# Patient Record
Sex: Female | Born: 1990 | Race: White | Hispanic: No | Marital: Single | Smoking: Current every day smoker
Health system: Southern US, Community
[De-identification: ages and names within clinical notes are randomized; demographics above are authoritative.]

## PROBLEM LIST (undated history)

## (undated) DIAGNOSIS — Z789 Other specified health status: Secondary | ICD-10-CM

## (undated) HISTORY — PX: NO PAST SURGERIES: SHX2092

---

## 2018-07-09 ENCOUNTER — Emergency Department: Payer: Medicaid - Out of State

## 2018-07-09 ENCOUNTER — Inpatient Hospital Stay: Payer: Medicaid - Out of State

## 2018-07-09 ENCOUNTER — Inpatient Hospital Stay
Admission: EM | Admit: 2018-07-09 | Discharge: 2018-07-12 | DRG: 417 | Disposition: A | Discharge status: Home / Self Care | Payer: Medicaid - Out of State | Attending: Specialist | Admitting: Specialist

## 2018-07-09 ENCOUNTER — Other Ambulatory Visit: Payer: Self-pay

## 2018-07-09 ENCOUNTER — Encounter: Payer: Self-pay | Admitting: Emergency Medicine

## 2018-07-09 DIAGNOSIS — K8021 Calculus of gallbladder without cholecystitis with obstruction: Secondary | ICD-10-CM

## 2018-07-09 DIAGNOSIS — K8063 Calculus of gallbladder and bile duct with acute cholecystitis with obstruction: Principal | ICD-10-CM | POA: Diagnosis present

## 2018-07-09 DIAGNOSIS — K851 Biliary acute pancreatitis without necrosis or infection: Secondary | ICD-10-CM | POA: Diagnosis present

## 2018-07-09 DIAGNOSIS — R740 Nonspecific elevation of levels of transaminase and lactic acid dehydrogenase [LDH]: Secondary | ICD-10-CM | POA: Diagnosis present

## 2018-07-09 DIAGNOSIS — F17200 Nicotine dependence, unspecified, uncomplicated: Secondary | ICD-10-CM | POA: Diagnosis present

## 2018-07-09 DIAGNOSIS — R1011 Right upper quadrant pain: Secondary | ICD-10-CM | POA: Diagnosis present

## 2018-07-09 DIAGNOSIS — K8050 Calculus of bile duct without cholangitis or cholecystitis without obstruction: Secondary | ICD-10-CM | POA: Diagnosis present

## 2018-07-09 DIAGNOSIS — K598 Other specified functional intestinal disorders: Secondary | ICD-10-CM | POA: Diagnosis present

## 2018-07-09 DIAGNOSIS — R112 Nausea with vomiting, unspecified: Secondary | ICD-10-CM | POA: Diagnosis present

## 2018-07-09 DIAGNOSIS — R748 Abnormal levels of other serum enzymes: Secondary | ICD-10-CM | POA: Diagnosis present

## 2018-07-09 DIAGNOSIS — Z1159 Encounter for screening for other viral diseases: Secondary | ICD-10-CM | POA: Diagnosis not present

## 2018-07-09 DIAGNOSIS — E876 Hypokalemia: Secondary | ICD-10-CM | POA: Diagnosis present

## 2018-07-09 DIAGNOSIS — R109 Unspecified abdominal pain: Secondary | ICD-10-CM

## 2018-07-09 HISTORY — DX: Other specified health status: Z78.9

## 2018-07-09 LAB — URINALYSIS, COMPLETE (UACMP) WITH MICROSCOPIC
Bacteria, UA: NONE SEEN
Bilirubin Urine: NEGATIVE
Glucose, UA: NEGATIVE mg/dL
Hgb urine dipstick: NEGATIVE
Ketones, ur: NEGATIVE mg/dL
Leukocytes,Ua: NEGATIVE
Nitrite: NEGATIVE
Protein, ur: NEGATIVE mg/dL
Specific Gravity, Urine: 1.024 (ref 1.005–1.030)
pH: 6 (ref 5.0–8.0)

## 2018-07-09 LAB — HEPATIC FUNCTION PANEL
ALT: 212 U/L — ABNORMAL HIGH (ref 0–44)
AST: 374 U/L — ABNORMAL HIGH (ref 15–41)
Albumin: 4.2 g/dL (ref 3.5–5.0)
Alkaline Phosphatase: 97 U/L (ref 38–126)
Bilirubin, Direct: 0.4 mg/dL — ABNORMAL HIGH (ref 0.0–0.2)
Indirect Bilirubin: 0.1 mg/dL — ABNORMAL LOW (ref 0.3–0.9)
Total Bilirubin: 0.5 mg/dL (ref 0.3–1.2)
Total Protein: 6.8 g/dL (ref 6.5–8.1)

## 2018-07-09 LAB — CBC WITH DIFFERENTIAL/PLATELET
Abs Immature Granulocytes: 0.07 10*3/uL (ref 0.00–0.07)
Basophils Absolute: 0 10*3/uL (ref 0.0–0.1)
Basophils Relative: 0 %
Eosinophils Absolute: 0 10*3/uL (ref 0.0–0.5)
Eosinophils Relative: 0 %
HCT: 37.5 % (ref 36.0–46.0)
Hemoglobin: 12.9 g/dL (ref 12.0–15.0)
Immature Granulocytes: 1 %
Lymphocytes Relative: 9 %
Lymphs Abs: 1.4 10*3/uL (ref 0.7–4.0)
MCH: 30.1 pg (ref 26.0–34.0)
MCHC: 34.4 g/dL (ref 30.0–36.0)
MCV: 87.6 fL (ref 80.0–100.0)
Monocytes Absolute: 1.3 10*3/uL — ABNORMAL HIGH (ref 0.1–1.0)
Monocytes Relative: 8 %
Neutro Abs: 12.6 10*3/uL — ABNORMAL HIGH (ref 1.7–7.7)
Neutrophils Relative %: 82 %
Platelets: 330 10*3/uL (ref 150–400)
RBC: 4.28 MIL/uL (ref 3.87–5.11)
RDW: 12.6 % (ref 11.5–15.5)
WBC: 15.3 10*3/uL — ABNORMAL HIGH (ref 4.0–10.5)
nRBC: 0 % (ref 0.0–0.2)

## 2018-07-09 LAB — COMPREHENSIVE METABOLIC PANEL
ALT: 207 U/L — ABNORMAL HIGH (ref 0–44)
AST: 366 U/L — ABNORMAL HIGH (ref 15–41)
Albumin: 4.1 g/dL (ref 3.5–5.0)
Alkaline Phosphatase: 105 U/L (ref 38–126)
Anion gap: 8 (ref 5–15)
BUN: 9 mg/dL (ref 6–20)
CO2: 25 mmol/L (ref 22–32)
Calcium: 9.1 mg/dL (ref 8.9–10.3)
Chloride: 104 mmol/L (ref 98–111)
Creatinine, Ser: 0.67 mg/dL (ref 0.44–1.00)
GFR calc Af Amer: >60 mL/min (ref >60)
GFR calc non Af Amer: >60 mL/min (ref >60)
Glucose, Bld: 130 mg/dL — ABNORMAL HIGH (ref 70–99)
Potassium: 3.2 mmol/L — ABNORMAL LOW (ref 3.5–5.1)
Sodium: 137 mmol/L (ref 135–145)
Total Bilirubin: 0.9 mg/dL (ref 0.3–1.2)
Total Protein: 7.1 g/dL (ref 6.5–8.1)

## 2018-07-09 LAB — SARS CORONAVIRUS 2 BY RT PCR (HOSPITAL ORDER, PERFORMED IN ~~LOC~~ HOSPITAL LAB): SARS Coronavirus 2: NEGATIVE

## 2018-07-09 LAB — LIPASE, BLOOD: Lipase: 83 U/L — ABNORMAL HIGH (ref 11–51)

## 2018-07-09 LAB — TROPONIN I: Troponin I: <0.03 ng/mL (ref <0.03)

## 2018-07-09 LAB — TSH: TSH: 0.535 u[IU]/mL (ref 0.350–4.500)

## 2018-07-09 LAB — POCT PREGNANCY, URINE: Preg Test, Ur: NEGATIVE

## 2018-07-09 LAB — HEMOGLOBIN A1C
Hgb A1c MFr Bld: 4.8 % (ref 4.8–5.6)
Mean Plasma Glucose: 91.06 mg/dL

## 2018-07-09 MED ORDER — GADOBUTROL 1 MMOL/ML IV SOLN
5.0000 mL | Freq: Once | INTRAVENOUS | Status: AC | PRN
  Administered 2018-07-09: 13:00:00 5 mL via INTRAVENOUS

## 2018-07-09 MED ORDER — ONDANSETRON HCL 4 MG/2ML IJ SOLN: 4.0000 mg | Freq: Four times a day (QID) | INTRAMUSCULAR | Status: DC | PRN

## 2018-07-09 MED ORDER — ACETAMINOPHEN 650 MG RE SUPP: 650.0000 mg | Freq: Four times a day (QID) | RECTAL | Status: DC | PRN

## 2018-07-09 MED ORDER — MORPHINE SULFATE (PF) 2 MG/ML IV SOLN
2.0000 mg | Freq: Once | INTRAVENOUS | Status: AC
  Administered 2018-07-09: 05:00:00 2 mg via INTRAVENOUS
  Filled 2018-07-09: qty 1

## 2018-07-09 MED ORDER — POTASSIUM CHLORIDE CRYS ER 20 MEQ PO TBCR
40.0000 meq | EXTENDED_RELEASE_TABLET | ORAL | Status: AC
  Administered 2018-07-09: 05:00:00 40 meq via ORAL
  Filled 2018-07-09: qty 2

## 2018-07-09 MED ORDER — ACETAMINOPHEN 325 MG PO TABS
650.0000 mg | ORAL_TABLET | Freq: Four times a day (QID) | ORAL | Status: DC | PRN
  Administered 2018-07-09 – 2018-07-10 (×2): 650 mg via ORAL
  Filled 2018-07-09 (×2): qty 2

## 2018-07-09 MED ORDER — SODIUM CHLORIDE 0.9 % IV SOLN
INTRAVENOUS | Status: DC
  Administered 2018-07-09 – 2018-07-12 (×10): via INTRAVENOUS

## 2018-07-09 MED ORDER — DOCUSATE SODIUM 100 MG PO CAPS
100.0000 mg | ORAL_CAPSULE | Freq: Two times a day (BID) | ORAL | Status: DC
  Administered 2018-07-09 – 2018-07-12 (×4): 100 mg via ORAL
  Filled 2018-07-09 (×5): qty 1

## 2018-07-09 MED ORDER — MORPHINE SULFATE (PF) 2 MG/ML IV SOLN
2.0000 mg | INTRAVENOUS | Status: DC | PRN
  Administered 2018-07-09 (×4): 4 mg via INTRAVENOUS
  Administered 2018-07-10 (×2): 2 mg via INTRAVENOUS
  Administered 2018-07-10: 4 mg via INTRAVENOUS
  Administered 2018-07-11 (×4): 2 mg via INTRAVENOUS
  Administered 2018-07-12: 4 mg via INTRAVENOUS
  Filled 2018-07-09: qty 2
  Filled 2018-07-09: qty 1
  Filled 2018-07-09 (×3): qty 2
  Filled 2018-07-09 (×5): qty 1
  Filled 2018-07-09 (×2): qty 2

## 2018-07-09 MED ORDER — MORPHINE SULFATE (PF) 2 MG/ML IV SOLN
2.0000 mg | Freq: Once | INTRAVENOUS | Status: AC
  Administered 2018-07-09: 04:00:00 2 mg via INTRAVENOUS
  Filled 2018-07-09: qty 1

## 2018-07-09 MED ORDER — ONDANSETRON HCL 4 MG/2ML IJ SOLN
4.0000 mg | Freq: Once | INTRAMUSCULAR | Status: AC
  Administered 2018-07-09: 04:00:00 4 mg via INTRAVENOUS
  Filled 2018-07-09: qty 2

## 2018-07-09 MED ORDER — ONDANSETRON HCL 4 MG PO TABS: 4.0000 mg | ORAL_TABLET | Freq: Four times a day (QID) | ORAL | Status: DC | PRN

## 2018-07-09 NOTE — H&P (Signed)
Lorraine Thompson is an 28 y.o. female.   Chief Complaint: Abdominal pain HPI: The patient with no chronic medical problems presents to the emergency department complaining of abdominal pain.  The patient's pain began approximately 8 hours prior to admission.  She admits to nausea and vomiting.  Emesis is been nonbloody.  She denies fever.  Ultrasound of the right upper quadrant showed gallstones as well as dilated bile duct suggestive of choledocholithiasis.  The patient was made n.p.o. and the hospitalist service was contacted for further management.  History reviewed. No pertinent past medical history. None  History reviewed. No pertinent surgical history. None  Family History  Problem Relation Age of Onset  . Huntington's disease Father    Social History:  has no history on file for tobacco, alcohol, and drug.  Allergies: No Known Allergies  No medications prior to admission.    Results for orders placed or performed during the hospital encounter of 07/09/18 (from the past 48 hour(s))  CBC with Differential     Status: Abnormal   Collection Time: 07/09/18 12:49 AM  Result Value Ref Range   WBC 15.3 (H) 4.0 - 10.5 K/uL   RBC 4.28 3.87 - 5.11 MIL/uL   Hemoglobin 12.9 12.0 - 15.0 g/dL   HCT 16.137.5 09.636.0 - 04.546.0 %   MCV 87.6 80.0 - 100.0 fL   MCH 30.1 26.0 - 34.0 pg   MCHC 34.4 30.0 - 36.0 g/dL   RDW 40.912.6 81.111.5 - 91.415.5 %   Platelets 330 150 - 400 K/uL   nRBC 0.0 0.0 - 0.2 %   Neutrophils Relative % 82 %   Neutro Abs 12.6 (H) 1.7 - 7.7 K/uL   Lymphocytes Relative 9 %   Lymphs Abs 1.4 0.7 - 4.0 K/uL   Monocytes Relative 8 %   Monocytes Absolute 1.3 (H) 0.1 - 1.0 K/uL   Eosinophils Relative 0 %   Eosinophils Absolute 0.0 0.0 - 0.5 K/uL   Basophils Relative 0 %   Basophils Absolute 0.0 0.0 - 0.1 K/uL   Immature Granulocytes 1 %   Abs Immature Granulocytes 0.07 0.00 - 0.07 K/uL    Comment: Performed at [facility], [address] [place], [place] [address]  Comprehensive  metabolic panel     Status: Abnormal   Collection Time: 07/09/18 12:49 AM  Result Value Ref Range   Sodium 137 135 - 145 mmol/L   Potassium 3.2 (L) 3.5 - 5.1 mmol/L   Chloride 104 98 - 111 mmol/L   CO2 25 22 - 32 mmol/L   Glucose, Bld 130 (H) 70 - 99 mg/dL   BUN 9 6 - 20 mg/dL   Creatinine, Ser 6.210.67 0.44 - 1.00 mg/dL   Calcium 9.1 8.9 - 30.810.3 mg/dL   Total Protein 7.1 6.5 - 8.1 g/dL   Albumin 4.1 3.5 - 5.0 g/dL   AST 657366 (H) 15 - 41 U/L   ALT 207 (H) 0 - 44 U/L   Alkaline Phosphatase 105 38 - 126 U/L   Total Bilirubin 0.9 0.3 - 1.2 mg/dL   GFR calc non Af Amer >60 >60 mL/min   GFR calc Af Amer >60 >60 mL/min   Anion gap 8 5 - 15    Comment: Performed at [facility], [address] [place], [place] [address]  Lipase, blood     Status: Abnormal   Collection Time: 07/09/18 12:49 AM  Result Value Ref Range   Lipase 83 (H) 11 - 51 U/L  Comment: Performed at [facility], [address] [place], [place] [address]  Troponin I - ONCE - STAT     Status: None   Collection Time: 07/09/18 12:49 AM  Result Value Ref Range   Troponin I <0.03 <0.03 ng/mL    Comment: Performed at [facility], [address] [place], [place] [address]  Urinalysis, Complete w Microscopic     Status: Abnormal   Collection Time: 07/09/18 12:49 AM  Result Value Ref Range   Color, Urine YELLOW (A) YELLOW   APPearance HAZY (A) CLEAR   Specific Gravity, Urine 1.024 1.005 - 1.030   pH 6.0 5.0 - 8.0   Glucose, UA NEGATIVE NEGATIVE mg/dL   Hgb urine dipstick NEGATIVE NEGATIVE   Bilirubin Urine NEGATIVE NEGATIVE   Ketones, ur NEGATIVE NEGATIVE mg/dL   Protein, ur NEGATIVE NEGATIVE mg/dL   Nitrite NEGATIVE NEGATIVE   Leukocytes,Ua NEGATIVE NEGATIVE   RBC / HPF 0-5 0 - 5 RBC/hpf   WBC, UA 0-5 0 - 5 WBC/hpf   Bacteria, UA NONE SEEN NONE SEEN   Squamous Epithelial / LPF 0-5 0 - 5   Mucus PRESENT     Comment: Performed at [facility], [address] [place],  [place] [address]  TSH     Status: None   Collection Time: 07/09/18 12:49 AM  Result Value Ref Range   TSH 0.535 0.350 - 4.500 uIU/mL    Comment: Performed by a 3rd Generation assay with a functional sensitivity of <=0.01 uIU/mL. Performed at [facility], [address] [place], [place] [address]   Pregnancy, urine POC     Status: None   Collection Time: 07/09/18  1:01 AM  Result Value Ref Range   Preg Test, Ur NEGATIVE NEGATIVE    Comment:        THE SENSITIVITY OF THIS METHODOLOGY IS >24 mIU/mL   SARS Coronavirus 2 (CEPHEID - Performed in [facility] hospital lab), Hosp Order     Status: None   Collection Time: 07/09/18  4:45 AM   Specimen: Nasopharyngeal Swab  Result Value Ref Range   SARS Coronavirus 2 NEGATIVE NEGATIVE    Comment: (NOTE) If result is NEGATIVE SARS-CoV-2 target nucleic acids are NOT DETECTED. The SARS-CoV-2 RNA is generally detectable in upper and lower  respiratory specimens during the acute phase of infection. The lowest  concentration of SARS-CoV-2 viral copies this assay can detect is 250  copies / mL. A negative result does not preclude SARS-CoV-2 infection  and should not be used as the sole basis for treatment or other  patient management decisions.  A negative result may occur with  improper specimen collection / handling, submission of specimen other  than nasopharyngeal swab, presence of viral mutation(s) within the  areas targeted by this assay, and inadequate number of viral copies  (<250 copies / mL). A negative result must be combined with clinical  observations, patient history, and epidemiological information. If result is POSITIVE SARS-CoV-2 target nucleic acids are DETECTED. The SARS-CoV-2 RNA is generally detectable in upper and lower  respiratory specimens dur ing the acute phase of infection.  Positive  results are indicative of active infection with SARS-CoV-2.  Clinical  correlation with patient history and other diagnostic  information is  necessary to determine patient infection status.  Positive results do  not rule out bacterial infection or co-infection with other viruses. If result is PRESUMPTIVE POSTIVE SARS-CoV-2 nucleic acids MAY BE PRESENT.   A presumptive positive result  was obtained on the submitted specimen  and confirmed on repeat testing.  While 2019 novel coronavirus  (SARS-CoV-2) nucleic acids may be present in the submitted sample  additional confirmatory testing may be necessary for epidemiological  and / or clinical management purposes  to differentiate between  SARS-CoV-2 and other Sarbecovirus currently known to infect humans.  If clinically indicated additional testing with an alternate test  methodology [phone]([id number]) is advised. The SARS-CoV-2 RNA is generally  detectable in upper and lower respiratory sp ecimens during the acute  phase of infection. The expected result is Negative. Fact Sheet for Patients:  BoilerBrush.com.cyhttps://www.fda.gov/media/136312/download Fact Sheet for Healthcare Providers: https://pope.com/https://www.fda.gov/media/136313/download This test is not yet approved or cleared by the [place] FDA and has been authorized for detection and/or diagnosis of SARS-CoV-2 by FDA under an Emergency Use Authorization (EUA).  This EUA will remain in effect (meaning this test can be used) for the duration of the COVID-19 declaration under Section 564(b)(1) of the Act, 21 U.S.C. section 360bbb-3(b)(1), unless the authorization is terminated or revoked sooner. Performed at [facility], [address] [place], [place] [address]    [place] Abdomen Limited Ruq  Result Date: 07/09/2018 CLINICAL DATA:  Right upper quadrant and epigastric pain beginning at 6 p.m. EXAM: ULTRASOUND ABDOMEN LIMITED RIGHT UPPER QUADRANT COMPARISON:  None. FINDINGS: Gallbladder: Multiple bile bowel layering shadowing stones are present. There is some layering fluid is well. Gallbladder wall thickness is within normal  limits. There is some hyperemia in the gallbladder fossa. Shadowing stones are evident in the neck of the gallbladder and proximal common bile duct. The technologist does not document a sonographic Murphy sign. Common bile duct: Diameter: 5.3 mm, within normal limits Liver: No focal lesion identified. Within normal limits in parenchymal echogenicity. Portal vein is patent on color Doppler imaging with normal direction of blood flow towards the liver. IMPRESSION: 1. Cholelithiasis. 2. Although there is no gallbladder wall thickening, there is some hyperemia in the gallbladder bed. This could indicate early inflammatory change. 3. Stones suspected in the gallbladder neck and possibly proximal common bile duct. Electronically Signed   By: Marin Robertshristopher  Mattern M.D.   On: 07/09/2018 04:18    Review of Systems  Constitutional: Negative for chills and fever.  HENT: Negative for sore throat and tinnitus.   Eyes: Negative for blurred vision and redness.  Respiratory: Negative for cough and shortness of breath.   Cardiovascular: Negative for chest pain, palpitations, orthopnea and PND.  Gastrointestinal: Negative for abdominal pain, diarrhea, nausea and vomiting.  Genitourinary: Negative for dysuria, frequency and urgency.  Musculoskeletal: Negative for joint pain and myalgias.  Skin: Negative for rash.       No lesions  Neurological: Negative for speech change, focal weakness and weakness.  Endo/Heme/Allergies: Does not bruise/bleed easily.       No temperature intolerance  Psychiatric/Behavioral: Negative for depression and suicidal ideas.    Blood pressure 125/84, pulse 79, temperature 98.6 F (37 C), temperature source Oral, resp. rate 18, height 5\' 4"  (1.626 m), weight 59.1 kg, last menstrual period 07/09/2018, SpO2 99 %. Physical Exam  Vitals reviewed. Constitutional: She is oriented to person, place, and time. She appears well-developed and well-nourished. No distress.  HENT:  Head:  Normocephalic and atraumatic.  Mouth/Throat: Oropharynx is clear and moist.  Eyes: Pupils are equal, round, and reactive to light. Conjunctivae and EOM are normal. No scleral icterus.  Neck: Normal range of motion. Neck supple. No JVD present. No tracheal deviation present. No thyromegaly present.  Cardiovascular: Normal rate, regular rhythm and normal heart sounds. Exam reveals no gallop and no friction rub.  No murmur heard. Respiratory: Effort normal and breath sounds normal.  GI: Soft. Bowel sounds are normal. She exhibits no distension and no mass. There is abdominal tenderness. There is guarding. There is no rebound.  Genitourinary:    Genitourinary Comments: Deferred   Musculoskeletal: Normal range of motion.        General: No edema.  Lymphadenopathy:    She has no cervical adenopathy.  Neurological: She is alert and oriented to person, place, and time. No cranial nerve deficit. She exhibits normal muscle tone.  Skin: Skin is warm and dry. No rash noted. No erythema.  Psychiatric: She has a normal mood and affect. Her behavior is normal. Judgment and thought content normal.     Assessment/Plan This is a 28 year old female admitted for choledocholithiasis. 1.  Choledocholithiasis: Manage severe pain with IV morphine.  Gastroenterology consulted for MRCP/ERCP.  She is n.p.o. 2.  Transaminitis: Elevated liver enzymes ostensibly due to obstructing stone.  She is hemodynamically stable.  She does not meet criteria for sepsis. 3.  Elevated lipase: Etiology as above.  Hydrate with normal saline 4.  Hypokalemia: Replete potassium. 5.  DVT prophylaxis: SCDs 6.  GI prophylaxis: None The patient is a full code.  Time spent on admission orders and patient care approximately 45 minutes  Harrie Foreman, MD 07/09/2018, 7:00 AM

## 2018-07-09 NOTE — ED Notes (Signed)
Patient in ultrasound.

## 2018-07-09 NOTE — ED Triage Notes (Signed)
Patient ambulatory to triage with steady gait, without difficulty or distress noted; pt reports mid upper abd pain since 6pm, nonradiating pain with no accomp symptoms; denies hx of same

## 2018-07-09 NOTE — Plan of Care (Signed)
Patient doing well.  Pain is being controlled with Morphine and Tylenol.  MRCP results are pending.  No significant changes. Patient did not want me to call family, she stated she will notify the family herself

## 2018-07-09 NOTE — ED Notes (Signed)
Updated patient's brother in law

## 2018-07-09 NOTE — Progress Notes (Signed)
[facility] [place] [facility]                                                                                                                                                                                  Patient Demographics   Lorraine FloridaJamie Thompson, is a 28 y.o. female, DOB - 08/17/1990, ZOX:[id number]  Admit date - 07/09/2018   Admitting Physician Arnaldo NatalMichael S Diamond, MD  Outpatient Primary MD for the patient is Patient, No Pcp Per   LOS - 0  Subjective: Patient states-she is now having abdominal pain right now but was severe yesterday    Review of Systems:   CONSTITUTIONAL: No documented fever. No fatigue, weakness. No weight gain, no weight loss.  EYES: No blurry or double vision.  ENT: No tinnitus. No postnasal drip. No redness of the oropharynx.  RESPIRATORY: No cough, no wheeze, no hemoptysis. No dyspnea.  CARDIOVASCULAR: No chest pain. No orthopnea. No palpitations. No syncope.  GASTROINTESTINAL: No nausea, no vomiting or diarrhea.  Positive abdominal pain. No melena or hematochezia.  GENITOURINARY: No dysuria or hematuria.  ENDOCRINE: No polyuria or nocturia. No heat or cold intolerance.  HEMATOLOGY: No anemia. No bruising. No bleeding.  INTEGUMENTARY: No rashes. No lesions.  MUSCULOSKELETAL: No arthritis. No swelling. No gout.  NEUROLOGIC: No numbness, tingling, or ataxia. No seizure-type activity.  PSYCHIATRIC: No anxiety. No insomnia. No ADD.    Vitals:   Vitals:   07/09/18 0415 07/09/18 0430 07/09/18 0500 07/09/18 0539  BP:  (!) 141/98 (!) 140/94 125/84  Pulse: 67 64 72 79  Resp:    18  Temp:    98.6 F (37 C)  TempSrc:    Oral  SpO2: 100% 100% 99% 99%  Weight:    59.1 kg  Height:    5\' 4"  (1.626 m)    Wt Readings from Last 3 Encounters:  07/09/18 59.1 kg     Intake/Output Summary (Last 24 hours) at 07/09/2018 1213 Last data filed at 07/09/2018 0707 Gross per 24 hour  Intake -  Output 0 ml  Net 0 ml    Physical Exam:   GENERAL:  Pleasant-appearing in no apparent distress.  HEAD, EYES, EARS, NOSE AND THROAT: Atraumatic, normocephalic. Extraocular muscles are intact. Pupils equal and reactive to light. Sclerae anicteric. No conjunctival injection. No oro-pharyngeal erythema.  NECK: Supple. There is no jugular venous distention. No bruits, no lymphadenopathy, no thyromegaly.  HEART: Regular rate and rhythm,. No murmurs, no rubs, no clicks.  LUNGS: Clear to auscultation bilaterally. No rales or rhonchi. No wheezes.  ABDOMEN: Soft, flat, nontender, nondistended. Has good bowel sounds. No hepatosplenomegaly appreciated.  EXTREMITIES: No evidence of any cyanosis, clubbing,  or peripheral edema.  +2 pedal and radial pulses bilaterally.  NEUROLOGIC: The patient is alert, awake, and oriented x3 with no focal motor or sensory deficits appreciated bilaterally.  SKIN: Moist and warm with no rashes appreciated.  Psych: Not anxious, depressed LN: No inguinal LN enlargement    Antibiotics   Anti-infectives (From admission, onward)   None      Medications   Scheduled Meds: . docusate sodium  100 mg Oral BID   Continuous Infusions: . sodium chloride 125 mL/hr at 07/09/18 0633   PRN Meds:.acetaminophen **OR** acetaminophen, morphine injection, ondansetron **OR** ondansetron (ZOFRAN) IV   Data Review:   Micro Results Recent Results (from the past 240 hour(s))  SARS Coronavirus 2 (CEPHEID - Performed in [facility] hospital lab), Hosp Order     Status: None   Collection Time: 07/09/18  4:45 AM   Specimen: Nasopharyngeal Swab  Result Value Ref Range Status   SARS Coronavirus 2 NEGATIVE NEGATIVE Final    Comment: (NOTE) If result is NEGATIVE SARS-CoV-2 target nucleic acids are NOT DETECTED. The SARS-CoV-2 RNA is generally detectable in upper and lower  respiratory specimens during the acute phase of infection. The lowest  concentration of SARS-CoV-2 viral copies this assay can detect is 250  copies / mL. A negative  result does not preclude SARS-CoV-2 infection  and should not be used as the sole basis for treatment or other  patient management decisions.  A negative result may occur with  improper specimen collection / handling, submission of specimen other  than nasopharyngeal swab, presence of viral mutation(s) within the  areas targeted by this assay, and inadequate number of viral copies  (<250 copies / mL). A negative result must be combined with clinical  observations, patient history, and epidemiological information. If result is POSITIVE SARS-CoV-2 target nucleic acids are DETECTED. The SARS-CoV-2 RNA is generally detectable in upper and lower  respiratory specimens dur ing the acute phase of infection.  Positive  results are indicative of active infection with SARS-CoV-2.  Clinical  correlation with patient history and other diagnostic information is  necessary to determine patient infection status.  Positive results do  not rule out bacterial infection or co-infection with other viruses. If result is PRESUMPTIVE POSTIVE SARS-CoV-2 nucleic acids MAY BE PRESENT.   A presumptive positive result was obtained on the submitted specimen  and confirmed on repeat testing.  While 2019 novel coronavirus  (SARS-CoV-2) nucleic acids may be present in the submitted sample  additional confirmatory testing may be necessary for epidemiological  and / or clinical management purposes  to differentiate between  SARS-CoV-2 and other Sarbecovirus currently known to infect humans.  If clinically indicated additional testing with an alternate test  methodology [phone]([id number]) is advised. The SARS-CoV-2 RNA is generally  detectable in upper and lower respiratory sp ecimens during the acute  phase of infection. The expected result is Negative. Fact Sheet for Patients:  BoilerBrush.com.cyhttps://www.fda.gov/media/136312/download Fact Sheet for Healthcare Providers: https://pope.com/https://www.fda.gov/media/136313/download This test is not yet  approved or cleared by the [place] FDA and has been authorized for detection and/or diagnosis of SARS-CoV-2 by FDA under an Emergency Use Authorization (EUA).  This EUA will remain in effect (meaning this test can be used) for the duration of the COVID-19 declaration under Section 564(b)(1) of the Act, 21 U.S.C. section 360bbb-3(b)(1), unless the authorization is terminated or revoked sooner. Performed at [facility], [address] [place], [place] [address]     Radiology Reports [place] Abdomen Limited Ruq  Result Date: 07/09/2018 CLINICAL DATA:  Right upper quadrant and epigastric pain beginning at 6 p.m. EXAM: ULTRASOUND ABDOMEN LIMITED RIGHT UPPER QUADRANT COMPARISON:  None. FINDINGS: Gallbladder: Multiple bile bowel layering shadowing stones are present. There is some layering fluid is well. Gallbladder wall thickness is within normal limits. There is some hyperemia in the gallbladder fossa. Shadowing stones are evident in the neck of the gallbladder and proximal common bile duct. The technologist does not document a sonographic Murphy sign. Common bile duct: Diameter: 5.3 mm, within normal limits Liver: No focal lesion identified. Within normal limits in parenchymal echogenicity. Portal vein is patent on color Doppler imaging with normal direction of blood flow towards the liver. IMPRESSION: 1. Cholelithiasis. 2. Although there is no gallbladder wall thickening, there is some hyperemia in the gallbladder bed. This could indicate early inflammatory change. 3. Stones suspected in the gallbladder neck and possibly proximal common bile duct. Electronically Signed   By: Marin Robertshristopher  Mattern M.D.   On: 07/09/2018 04:18     CBC Recent Labs  Lab 07/09/18 0049  WBC 15.3*  HGB 12.9  HCT 37.5  PLT 330  MCV 87.6  MCH 30.1  MCHC 34.4  RDW 12.6  LYMPHSABS 1.4  MONOABS 1.3*  EOSABS 0.0  BASOSABS 0.0    Chemistries  Recent Labs  Lab 07/09/18 0049 07/09/18 0630  NA 137  --    K 3.2*  --   CL 104  --   CO2 25  --   GLUCOSE 130*  --   BUN 9  --   CREATININE 0.67  --   CALCIUM 9.1  --   AST 366* 374*  ALT 207* 212*  ALKPHOS 105 97  BILITOT 0.9 0.5   ------------------------------------------------------------------------------------------------------------------ estimated creatinine clearance is 90.4 mL/min (by C-G formula based on SCr of 0.67 mg/dL). ------------------------------------------------------------------------------------------------------------------ Recent Labs    07/09/18 0049  HGBA1C 4.8   ------------------------------------------------------------------------------------------------------------------ No results for input(s): CHOL, HDL, LDLCALC, TRIG, CHOLHDL, LDLDIRECT in the last 72 hours. ------------------------------------------------------------------------------------------------------------------ Recent Labs    07/09/18 0049  TSH 0.535   ------------------------------------------------------------------------------------------------------------------ No results for input(s): VITAMINB12, FOLATE, FERRITIN, TIBC, IRON, RETICCTPCT in the last 72 hours.  Coagulation profile No results for input(s): INR, PROTIME in the last 168 hours.  No results for input(s): DDIMER in the last 72 hours.  Cardiac Enzymes Recent Labs  Lab 07/09/18 0049  TROPONINI <0.03   ------------------------------------------------------------------------------------------------------------------ Invalid input(s): POCBNP    Assessment & Plan   This is a 28 year old female admitted for choledocholithiasis. 1.  Abdominal pain with possible Choledocholithiasis:  I will obtain MRCP GI consult pending  2.  Transaminitis: Elevated liver enzymes ostensibly due to obstructing stone although bilirubin is normal Will follow liver function in the morning MRCP as above  3.  Elevated lipase: Etiology as above.  Hydrate with normal saline  4.   Hypokalemia: Replete potassium.  5.  DVT prophylaxis: SCDs  6.  GI prophylaxis: None     Code Status Orders  (From admission, onward)         Start     Ordered   07/09/18 0546  Full code  Continuous     07/09/18 0545        Code Status History    This patient has a current code status but no historical code status.   Advance Care Planning Activity           Consults gastroenterology   DVT Prophylaxis SCD  Lab Results  Component Value Date  PLT 330 07/09/2018     Time Spent in minutes   45 minutes spent from 11 AM to 11:45 AM greater than 50% of time spent in care coordination and counseling patient regarding the condition and plan of care.   Dustin Flock M.D on 07/09/2018 at 12:13 PM  Between 7am to 6pm - Pager - [phone]  After 6pm go to [email or web address] - Proofreader  [employer] [facility]   Office  [phone]

## 2018-07-09 NOTE — ED Provider Notes (Signed)
[facility] Emergency Department Provider Note    First MD Initiated Contact with Patient 07/09/18 [phone]     (approximate)  I have reviewed the triage vital signs and the nursing notes.   HISTORY  Chief Complaint Abdominal Pain   HPI Lorraine Thompson is a 28 y.o. female presents to the emergency department with 10 out of 10 mid/right upper quadrant abdominal pain since 5 PM.  Patient also admits to nausea and vomiting.  Patient denies any fever.  Patient denies any urinary symptoms.  Patient denies any diarrhea constipation.     Past medical history None  History reviewed. No pertinent surgical history.  Prior to Admission medications   Not on File    Allergies Patient has no known allergies.  No family history on file.  Social History Social History   Tobacco Use  . Smoking status: Not on file  Substance Use Topics  . Alcohol use: Not on file  . Drug use: Not on file    Review of Systems Constitutional: No fever/chills Eyes: No visual changes. ENT: No sore throat. Cardiovascular: Denies chest pain. Respiratory: Denies shortness of breath. Gastrointestinal: Positive for abdominal pain nausea and vomiting. no diarrhea.  No constipation. Genitourinary: Negative for dysuria. Musculoskeletal: Negative for neck pain.  Negative for back pain. Integumentary: Negative for rash. Neurological: Negative for headaches, focal weakness or numbness.   ____________________________________________   PHYSICAL EXAM:  VITAL SIGNS: ED Triage Vitals  Enc Vitals Group     BP 07/09/18 0049 136/85     Pulse Rate 07/09/18 0049 90     Resp 07/09/18 0049 (!) 22     Temp 07/09/18 0049 98.1 F (36.7 C)     Temp Source 07/09/18 0049 Oral     SpO2 07/09/18 0049 100 %     Weight --      Height --      Head Circumference --      Peak Flow --      Pain Score 07/09/18 0030 10     Pain Loc --      Pain Edu? --      Excl. in GC? --     Constitutional:  Alert and oriented.  Apparent discomfort Eyes: Conjunctivae are normal. Mouth/Throat: Mucous membranes are moist Oropharynx non-erythematous. Neck: No stridor.   Cardiovascular: Normal rate, regular rhythm. Good peripheral circulation. Grossly normal heart sounds. Respiratory: Normal respiratory effort.  No retractions. No audible wheezing. Gastrointestinal: Right upper quadrant tenderness to palpation. no distention.  Musculoskeletal: No lower extremity tenderness nor edema. No gross deformities of extremities. Neurologic:  Normal speech and language. No gross focal neurologic deficits are appreciated.  Skin:  Skin is warm, dry and intact. No rash noted. Psychiatric: Mood and affect are normal. Speech and behavior are normal.  ____________________________________________   LABS (all labs ordered are listed, but only abnormal results are displayed)  Labs Reviewed  CBC WITH DIFFERENTIAL/PLATELET - Abnormal; Notable for the following components:      Result Value   WBC 15.3 (*)    Neutro Abs 12.6 (*)    Monocytes Absolute 1.3 (*)    All other components within normal limits  COMPREHENSIVE METABOLIC PANEL - Abnormal; Notable for the following components:   Potassium 3.2 (*)    Glucose, Bld 130 (*)    AST 366 (*)    ALT 207 (*)    All other components within normal limits  LIPASE, BLOOD - Abnormal; Notable for the following components:  Lipase 83 (*)    All other components within normal limits  URINALYSIS, COMPLETE (UACMP) WITH MICROSCOPIC - Abnormal; Notable for the following components:   Color, Urine YELLOW (*)    APPearance HAZY (*)    All other components within normal limits  SARS CORONAVIRUS 2 (HOSPITAL ORDER, PERFORMED IN [facility])  TROPONIN I  POCT PREGNANCY, URINE   ____________________________________________  EKG  ED ECG REPORT I, [place] N BROWN, the attending physician, personally viewed and interpreted this ECG.   Date: 07/09/2018  EKG  Time: 12:54 AM  Rate: 84  Rhythm: Normal Sinus Rhythm  Axis: Normal  Intervals:Normal  ST&T Change: None  ____________________________________________  RADIOLOGY I, [place] N BROWN, personally viewed and evaluated these images (plain radiographs) as part of my medical decision making, as well as reviewing the written report by the radiologist.  ED MD interpretation: Cholelithiasis with evidence of choledocholithiasis  Official radiology report(s): Koreas Abdomen Limited Ruq  Result Date: 07/09/2018 CLINICAL DATA:  Right upper quadrant and epigastric pain beginning at 6 p.m. EXAM: ULTRASOUND ABDOMEN LIMITED RIGHT UPPER QUADRANT COMPARISON:  None. FINDINGS: Gallbladder: Multiple bile bowel layering shadowing stones are present. There is some layering fluid is well. Gallbladder wall thickness is within normal limits. There is some hyperemia in the gallbladder fossa. Shadowing stones are evident in the neck of the gallbladder and proximal common bile duct. The technologist does not document a sonographic Murphy sign. Common bile duct: Diameter: 5.3 mm, within normal limits Liver: No focal lesion identified. Within normal limits in parenchymal echogenicity. Portal vein is patent on color Doppler imaging with normal direction of blood flow towards the liver. IMPRESSION: 1. Cholelithiasis. 2. Although there is no gallbladder wall thickening, there is some hyperemia in the gallbladder bed. This could indicate early inflammatory change. 3. Stones suspected in the gallbladder neck and possibly proximal common bile duct. Electronically Signed   By: Marin Robertshristopher  Mattern M.D.   On: 07/09/2018 04:18    Procedures   ____________________________________________   INITIAL IMPRESSION / MDM / ASSESSMENT AND PLAN / ED COURSE  As part of my medical decision making, I reviewed the following data within the electronic MEDICAL RECORD NUMBER  -year-old female present with above-stated history and physical exam  concerning for cholelithiasis versus choledocholithiasis which was confirmed on ultrasound.  Patient given IV morphine with improvement of pain.  Patient also given IV Zofran with no further nausea.  Patient discussed with Dr. Sheryle Haildiamond for hospital admission further evaluation and management.   *Lorraine FloridaJamie Sen was evaluated in Emergency Department on 07/09/2018 for the symptoms described in the history of present illness. She was evaluated in the context of the global COVID-19 pandemic, which necessitated consideration that the patient might be at risk for infection with the SARS-CoV-2 virus that causes COVID-19. Institutional protocols and algorithms that pertain to the evaluation of patients at risk for COVID-19 are in a state of rapid change based on information released by regulatory bodies including the CDC and federal and state organizations. These policies and algorithms were followed during the patient's care in the ED.  Some ED evaluations and interventions may be delayed as a result of limited staffing during the pandemic.*    ____________________________________________  FINAL CLINICAL IMPRESSION(S) / ED DIAGNOSES  Final diagnoses:  RUQ pain  Choledocholithiasis  Calculus of gallbladder with biliary obstruction but without cholecystitis     MEDICATIONS GIVEN DURING THIS VISIT:  Medications  morphine 2 MG/ML injection 2 mg (has no administration in time range)  morphine 2 MG/ML injection 2 mg (2 mg Intravenous Given 07/09/18 0417)  ondansetron (ZOFRAN) injection 4 mg (4 mg Intravenous Given 07/09/18 0417)  potassium chloride SA (K-DUR) CR tablet 40 mEq (40 mEq Oral Given 07/09/18 0437)     ED Discharge Orders    None       Note:  This document was prepared using Dragon voice recognition software and may include unintentional dictation errors.   Gregor Hams, MD 07/09/18 [phone]

## 2018-07-09 NOTE — Consult Note (Signed)
Cephas Darby, MD [address]  [place]  [place], [place] [address]  Main: [phone]  Fax: [phone] Pager: [phone]   Consultation  Referring Provider:     No ref. provider found Primary Care Physician:  Patient, No Pcp Per Primary Gastroenterologist: None     Reason for Consultation:     Evaluate for choledocholithiasis  Date of Admission:  07/09/2018 Date of Consultation:  07/09/2018         HPI:   Lorraine Thompson is a 28 y.o. female with 2 days history of right upper quadrant and epigastric pain associated with nausea.  Patient reports onset of pain on Sunday which got worse last night.  Today, she reports her pain is severe in right upper quadrant only.  She denied fever, chills, vomiting.  She is found to have elevated transaminases, mildly elevated direct bilirubin, lipase is mildly elevated as well.  Ultrasound liver revealed cholelithiasis, possible stone in the common bile duct, normal caliber CBD.  Patient is undergoing MRCP this morning   NSAIDs: None  Antiplts/Anticoagulants/Anti thrombotics: None  GI Procedures: None Reports family history of cholelithiasis in her mother and her sister, underwent cholecystectomy  History reviewed. No pertinent past medical history.  History reviewed. No pertinent surgical history.  Prior to Admission medications   Not on File   Current Facility-Administered Medications:  .  0.9 %  sodium chloride infusion, , Intravenous, Continuous, Harrie Foreman, MD, Last Rate: 125 mL/hr at 07/09/18 [id number] .  acetaminophen (TYLENOL) tablet 650 mg, 650 mg, Oral, Q6H PRN, 650 mg at 07/09/18 0932 **OR** acetaminophen (TYLENOL) suppository 650 mg, 650 mg, Rectal, Q6H PRN, Harrie Foreman, MD .  docusate sodium (COLACE) capsule 100 mg, 100 mg, Oral, BID, Harrie Foreman, MD, 100 mg at 07/09/18 0932 .  morphine 2 MG/ML injection 2-4 mg, 2-4 mg, Intravenous, Q4H PRN, Harrie Foreman, MD, 4 mg at 07/09/18 1136 .   ondansetron (ZOFRAN) tablet 4 mg, 4 mg, Oral, Q6H PRN **OR** ondansetron (ZOFRAN) injection 4 mg, 4 mg, Intravenous, Q6H PRN, Harrie Foreman, MD   Family History  Problem Relation Age of Onset  . Huntington's disease Father      Social History   Tobacco Use  . Smoking status: Current Every Day Smoker  . Smokeless tobacco: Never Used  Substance Use Topics  . Alcohol use: Not on file  . Drug use: Not on file    Allergies as of 07/09/2018  . (No Known Allergies)    Review of Systems:    All systems reviewed and negative except where noted in HPI.   Physical Exam:  Vital signs in last 24 hours: Temp:  [98.1 F (36.7 C)-98.6 F (37 C)] 98.3 F (36.8 C) (06/16 1225) Pulse Rate:  [64-90] 82 (06/16 1225) Resp:  [18-22] 18 (06/16 1225) BP: (119-141)/(80-98) 124/80 (06/16 1225) SpO2:  [97 %-100 %] 97 % (06/16 1225) Weight:  [59.1 kg] 59.1 kg (06/16 0539) Last BM Date: 07/08/18 General:   Pleasant, cooperative in NAD Head:  Normocephalic and atraumatic. Eyes:   No icterus.   Conjunctiva pink. PERRLA. Ears:  Normal auditory acuity. Neck:  Supple; no masses or thyroidomegaly Lungs: Respirations even and unlabored. Lungs clear to auscultation bilaterally.   No wheezes, crackles, or rhonchi.  Heart:  Regular rate and rhythm;  Without murmur, clicks, rubs or gallops Abdomen:  Soft, nondistended, right upper quadrant and epigastric tenderness. Normal bowel sounds. No appreciable masses or hepatomegaly.  No  rebound or guarding.  Rectal:  Not performed. Msk:  Symmetrical without gross deformities.  Strength normal Extremities:  Without edema, cyanosis or clubbing. Neurologic:  Alert and oriented x3;  grossly normal neurologically. Skin:  Intact without significant lesions or rashes. Psych:  Alert and cooperative. Normal affect.  LAB RESULTS: CBC Latest Ref Rng & Units 07/09/2018  WBC 4.0 - 10.5 K/uL 15.3(H)  Hemoglobin 12.0 - 15.0 g/dL 12.9  Hematocrit 36.0 - 46.0 % 37.5   Platelets 150 - 400 K/uL 330    BMET BMP Latest Ref Rng & Units 07/09/2018  Glucose 70 - 99 mg/dL 130(H)  BUN 6 - 20 mg/dL 9  Creatinine 0.44 - 1.00 mg/dL 0.67  Sodium 135 - 145 mmol/L 137  Potassium 3.5 - 5.1 mmol/L 3.2(L)  Chloride 98 - 111 mmol/L 104  CO2 22 - 32 mmol/L 25  Calcium 8.9 - 10.3 mg/dL 9.1    LFT Hepatic Function Latest Ref Rng & Units 07/09/2018 07/09/2018  Total Protein 6.5 - 8.1 g/dL 6.8 7.1  Albumin 3.5 - 5.0 g/dL 4.2 4.1  AST 15 - 41 U/L 374(H) 366(H)  ALT 0 - 44 U/L 212(H) 207(H)  Alk Phosphatase 38 - 126 U/L 97 105  Total Bilirubin 0.3 - 1.2 mg/dL 0.5 0.9  Bilirubin, Direct 0.0 - 0.2 mg/dL 0.4(H) -     STUDIES: US Abdomen Limited Ruq  Result Date: 07/09/2018 CLINICAL DATA:  Right upper quadrant and epigastric pain beginning at 6 p.m. EXAM: ULTRASOUND ABDOMEN LIMITED RIGHT UPPER QUADRANT COMPARISON:  None. FINDINGS: Gallbladder: Multiple bile bowel layering shadowing stones are present. There is some layering fluid is well. Gallbladder wall thickness is within normal limits. There is some hyperemia in the gallbladder fossa. Shadowing stones are evident in the neck of the gallbladder and proximal common bile duct. The technologist does not document a sonographic Murphy sign. Common bile duct: Diameter: 5.3 mm, within normal limits Liver: No focal lesion identified. Within normal limits in parenchymal echogenicity. Portal vein is patent on color Doppler imaging with normal direction of blood flow towards the liver. IMPRESSION: 1. Cholelithiasis. 2. Although there is no gallbladder wall thickening, there is some hyperemia in the gallbladder bed. This could indicate early inflammatory change. 3. Stones suspected in the gallbladder neck and possibly proximal common bile duct. Electronically Signed   By: San Morelle M.D.   On: 07/09/2018 04:18      Impression / Plan:   Lorraine Thompson is a 28 y.o. female with no significant past medical history admitted with  cholelithiasis, mild gallstone pancreatitis, tobacco use, GI consulted to evaluate for choledocholithiasis.  No evidence of cholangitis  Continue IV fluids Pain control Agree with MRI/MRCP ERCP based on MRCP findings Monitor LFTs Timing of cholecystectomy per general surgery  Thank you for involving me in the care of this patient.  Will follow along with you    LOS: 0 days   Sherri Sear, MD  07/09/2018, 1:53 PM   Note: This dictation was prepared with Dragon dictation along with smaller phrase technology. Any transcriptional errors that result from this process are unintentional.

## 2018-07-09 NOTE — ED Notes (Signed)
ED TO INPATIENT HANDOFF REPORT  ED Nurse Thompson and Phone #: Betti CruzKate  S Thompson/Age/Gender Lorraine FloridaJamie Mansour 28 y.o. female Room/Bed: ED02A/ED02A  Code Status   Code Status: Not on file  Home/SNF/Other Home Patient oriented to: self, place, time and situation Is this baseline? Yes   Triage Complete: Triage complete  Chief Complaint Chest pain  Triage Note Patient ambulatory to triage with steady gait, without difficulty or distress noted; pt reports mid upper abd pain since 6pm, nonradiating pain with no accomp symptoms; denies hx of same   Allergies No Known Allergies  Level of Care/Admitting Diagnosis ED Disposition    ED Disposition Condition Comment   Admit  [facility] [[id number]]  Level of Care: Med-Surg [16]  Covid Evaluation: Screening Protocol (No Symptoms)  Diagnosis: Choledocholithiasis [132440][[id number]]  Admitting Physician: Arnaldo NatalDIAMOND, MICHAEL S [[id number]]  Attending Physician: Arnaldo NatalDIAMOND, MICHAEL S [[id number]]  Estimated length of stay: past midnight tomorrow  Certification:: I certify this patient will need inpatient services for at least 2 midnights  PT Class (Do Not Modify): Inpatient [101]  PT Acc Code (Do Not Modify): Private [1]       B Medical/Surgery History History reviewed. No pertinent past medical history. History reviewed. No pertinent surgical history.   A IV Location/Drains/Wounds Patient Lines/Drains/Airways Status   Active Line/Drains/Airways    Thompson:   Placement date:   Placement time:   Site:   Days:   Peripheral IV 07/09/18 Right Antecubital   07/09/18    0418    Antecubital   less than 1          Intake/Output Last 24 hours No intake or output data in the 24 hours ending 07/09/18 0501  Labs/Imaging Results for orders placed or performed during the hospital encounter of 07/09/18 (from the past 48 hour(s))  CBC with Differential     Status: Abnormal   Collection Time: 07/09/18 12:49 AM  Result Value Ref Range    WBC 15.3 (H) 4.0 - 10.5 K/uL   RBC 4.28 3.87 - 5.11 MIL/uL   Hemoglobin 12.9 12.0 - 15.0 g/dL   HCT 74.237.5 59.536.0 - 63.846.0 %   MCV 87.6 80.0 - 100.0 fL   MCH 30.1 26.0 - 34.0 pg   MCHC 34.4 30.0 - 36.0 g/dL   RDW 75.612.6 43.311.5 - 29.515.5 %   Platelets 330 150 - 400 K/uL   nRBC 0.0 0.0 - 0.2 %   Neutrophils Relative % 82 %   Neutro Abs 12.6 (H) 1.7 - 7.7 K/uL   Lymphocytes Relative 9 %   Lymphs Abs 1.4 0.7 - 4.0 K/uL   Monocytes Relative 8 %   Monocytes Absolute 1.3 (H) 0.1 - 1.0 K/uL   Eosinophils Relative 0 %   Eosinophils Absolute 0.0 0.0 - 0.5 K/uL   Basophils Relative 0 %   Basophils Absolute 0.0 0.0 - 0.1 K/uL   Immature Granulocytes 1 %   Abs Immature Granulocytes 0.07 0.00 - 0.07 K/uL    Comment: Performed at [facility], [address] [place], [place] [address]  Comprehensive metabolic panel     Status: Abnormal   Collection Time: 07/09/18 12:49 AM  Result Value Ref Range   Sodium 137 135 - 145 mmol/L   Potassium 3.2 (L) 3.5 - 5.1 mmol/L   Chloride 104 98 - 111 mmol/L   CO2 25 22 - 32 mmol/L   Glucose, Bld 130 (H) 70 - 99 mg/dL   BUN 9 6 - 20 mg/dL  Creatinine, Ser 0.67 0.44 - 1.00 mg/dL   Calcium 9.1 8.9 - 10.3 mg/dL   Total Protein 7.1 6.5 - 8.1 g/dL   Albumin 4.1 3.5 - 5.0 g/dL   AST 366 (H) 15 - 41 U/L   ALT 207 (H) 0 - 44 U/L   Alkaline Phosphatase 105 38 - 126 U/L   Total Bilirubin 0.9 0.3 - 1.2 mg/dL   GFR calc non Af Amer >60 >60 mL/min   GFR calc Af Amer >60 >60 mL/min   Anion gap 8 5 - 15    Comment: Performed at [facility], [address] [place], [place] [address]  Lipase, blood     Status: Abnormal   Collection Time: 07/09/18 12:49 AM  Result Value Ref Range   Lipase 83 (H) 11 - 51 U/L    Comment: Performed at [facility], [address] [place], [place] [address]  Troponin I - ONCE - STAT     Status: None   Collection Time: 07/09/18 12:49 AM  Result Value Ref Range   Troponin I <0.03 <0.03 ng/mL    Comment: Performed  at [facility], [address] [place], [place] [address]  Urinalysis, Complete w Microscopic     Status: Abnormal   Collection Time: 07/09/18 12:49 AM  Result Value Ref Range   Color, Urine YELLOW (A) YELLOW   APPearance HAZY (A) CLEAR   Specific Gravity, Urine 1.024 1.005 - 1.030   pH 6.0 5.0 - 8.0   Glucose, UA NEGATIVE NEGATIVE mg/dL   Hgb urine dipstick NEGATIVE NEGATIVE   Bilirubin Urine NEGATIVE NEGATIVE   Ketones, ur NEGATIVE NEGATIVE mg/dL   Protein, ur NEGATIVE NEGATIVE mg/dL   Nitrite NEGATIVE NEGATIVE   Leukocytes,Ua NEGATIVE NEGATIVE   RBC / HPF 0-5 0 - 5 RBC/hpf   WBC, UA 0-5 0 - 5 WBC/hpf   Bacteria, UA NONE SEEN NONE SEEN   Squamous Epithelial / LPF 0-5 0 - 5   Mucus PRESENT     Comment: Performed at [facility], [address] [place],  [address]  Pregnancy, urine POC     Status: None   Collection Time: 07/09/18  1:01 AM  Result Value Ref Range   Preg Test, Ur NEGATIVE NEGATIVE    Comment:        THE SENSITIVITY OF THIS METHODOLOGY IS >24 mIU/mL    US Abdomen Limited Ruq  Result Date: 07/09/2018 CLINICAL DATA:  Right upper quadrant and epigastric pain beginning at 6 p.m. EXAM: ULTRASOUND ABDOMEN LIMITED RIGHT UPPER QUADRANT COMPARISON:  None. FINDINGS: Gallbladder: Multiple bile bowel layering shadowing stones are present. There is some layering fluid is well. Gallbladder wall thickness is within normal limits. There is some hyperemia in the gallbladder fossa. Shadowing stones are evident in the neck of the gallbladder and proximal common bile duct. The technologist does not document a sonographic Murphy sign. Common bile duct: Diameter: 5.3 mm, within normal limits Liver: No focal lesion identified. Within normal limits in parenchymal echogenicity. Portal vein is patent on color Doppler imaging with normal direction of blood flow towards the liver. IMPRESSION: 1. Cholelithiasis. 2. Although there is no gallbladder wall thickening, there  is some hyperemia in the gallbladder bed. This could indicate early inflammatory change. 3. Stones suspected in the gallbladder neck and possibly proximal common bile duct. Electronically Signed   By: San Morelle M.D.   On: 07/09/2018 04:18    Pending Labs Unresulted Labs (From admission, onward)  Start     Ordered   07/09/18 0441  SARS Coronavirus 2 (CEPHEID - Performed in [facility] lab), Hosp Order  (Asymptomatic Patients Labs)  ONCE - STAT,   STAT    Question:  Rule Out  Answer:  Yes   07/09/18 0440   Signed and Held  TSH  Add-on,   R     Signed and Held   Signed and Held  Hemoglobin A1c  Add-on,   R     Signed and Held          Vitals/Pain Today's Vitals   07/09/18 0418 07/09/18 0430 07/09/18 0459 07/09/18 0500  BP:  (!) 141/98  (!) 140/94  Pulse:  64  72  Resp:      Temp:      TempSrc:      SpO2:  100%  99%  PainSc: 9   7      Isolation Precautions No active isolations  Medications Medications  morphine 2 MG/ML injection 2 mg (has no administration in time range)  morphine 2 MG/ML injection 2 mg (2 mg Intravenous Given 07/09/18 0417)  ondansetron (ZOFRAN) injection 4 mg (4 mg Intravenous Given 07/09/18 0417)  potassium chloride SA (K-DUR) CR tablet 40 mEq (40 mEq Oral Given 07/09/18 0437)    Mobility walks Low fall risk   Focused Assessments Pulmonary Assessment Handoff:  Lung sounds:   O2 Device: Room Air        R Recommendations: See Admitting Provider Note  Report given to:   Additional Notes:

## 2018-07-10 ENCOUNTER — Inpatient Hospital Stay: Payer: Medicaid - Out of State

## 2018-07-10 ENCOUNTER — Encounter: Payer: Self-pay | Admitting: Radiology

## 2018-07-10 DIAGNOSIS — K8020 Calculus of gallbladder without cholecystitis without obstruction: Secondary | ICD-10-CM

## 2018-07-10 LAB — COMPREHENSIVE METABOLIC PANEL
ALT: 1143 U/L — ABNORMAL HIGH (ref 0–44)
AST: 659 U/L — ABNORMAL HIGH (ref 15–41)
Albumin: 4.1 g/dL (ref 3.5–5.0)
Alkaline Phosphatase: 153 U/L — ABNORMAL HIGH (ref 38–126)
Anion gap: 9 (ref 5–15)
BUN: 7 mg/dL (ref 6–20)
CO2: 24 mmol/L (ref 22–32)
Calcium: 8.9 mg/dL (ref 8.9–10.3)
Chloride: 106 mmol/L (ref 98–111)
Creatinine, Ser: 0.66 mg/dL (ref 0.44–1.00)
GFR calc Af Amer: >60 mL/min (ref >60)
GFR calc non Af Amer: >60 mL/min (ref >60)
Glucose, Bld: 97 mg/dL (ref 70–99)
Potassium: 4.3 mmol/L (ref 3.5–5.1)
Sodium: 139 mmol/L (ref 135–145)
Total Bilirubin: 0.9 mg/dL (ref 0.3–1.2)
Total Protein: 6.8 g/dL (ref 6.5–8.1)

## 2018-07-10 LAB — CBC
HCT: 38.2 % (ref 36.0–46.0)
Hemoglobin: 12.9 g/dL (ref 12.0–15.0)
MCH: 30.2 pg (ref 26.0–34.0)
MCHC: 33.8 g/dL (ref 30.0–36.0)
MCV: 89.5 fL (ref 80.0–100.0)
Platelets: 288 10*3/uL (ref 150–400)
RBC: 4.27 MIL/uL (ref 3.87–5.11)
RDW: 12.7 % (ref 11.5–15.5)
WBC: 6.6 10*3/uL (ref 4.0–10.5)
nRBC: 0 % (ref 0.0–0.2)

## 2018-07-10 LAB — BILIRUBIN, DIRECT: Bilirubin, Direct: 0.3 mg/dL — ABNORMAL HIGH (ref 0.0–0.2)

## 2018-07-10 LAB — ACETAMINOPHEN LEVEL: Acetaminophen (Tylenol), Serum: <10 ug/mL — ABNORMAL LOW (ref 10–30)

## 2018-07-10 LAB — LIPASE, BLOOD: Lipase: 69 U/L — ABNORMAL HIGH (ref 11–51)

## 2018-07-10 MED ORDER — CEFAZOLIN SODIUM-DEXTROSE 2-4 GM/100ML-% IV SOLN
2.0000 g | INTRAVENOUS | Status: AC
  Administered 2018-07-11: 2 g via INTRAVENOUS
  Filled 2018-07-10: qty 100

## 2018-07-10 MED ORDER — TECHNETIUM TC 99M MEBROFENIN IV KIT
5.2680 | PACK | Freq: Once | INTRAVENOUS | Status: AC | PRN
  Administered 2018-07-10: 5.268 via INTRAVENOUS

## 2018-07-10 MED ORDER — CEFAZOLIN (ANCEF) 1 G IV SOLR: 2.0000 g | INTRAVENOUS | Status: DC

## 2018-07-10 NOTE — Progress Notes (Signed)
[facility] [place] [facility]                                                                                                                                                                                  Patient Demographics   Lorraine Thompson, is a 28 y.o. female, DOB - 09/20/1990, PIR:[id number]  Admit date - 07/09/2018   Admitting Physician Arnaldo NatalMichael S Diamond, MD  Outpatient Primary MD for the patient is Patient, No Pcp Per   LOS - 1  Subjective: Patient does not have any further abdominal pain   Review of Systems:   CONSTITUTIONAL: No documented fever. No fatigue, weakness. No weight gain, no weight loss.  EYES: No blurry or double vision.  ENT: No tinnitus. No postnasal drip. No redness of the oropharynx.  RESPIRATORY: No cough, no wheeze, no hemoptysis. No dyspnea.  CARDIOVASCULAR: No chest pain. No orthopnea. No palpitations. No syncope.  GASTROINTESTINAL: No nausea, no vomiting or diarrhea.  Resolved abdominal pain. No melena or hematochezia.  GENITOURINARY: No dysuria or hematuria.  ENDOCRINE: No polyuria or nocturia. No heat or cold intolerance.  HEMATOLOGY: No anemia. No bruising. No bleeding.  INTEGUMENTARY: No rashes. No lesions.  MUSCULOSKELETAL: No arthritis. No swelling. No gout.  NEUROLOGIC: No numbness, tingling, or ataxia. No seizure-type activity.  PSYCHIATRIC: No anxiety. No insomnia. No ADD.    Vitals:   Vitals:   07/09/18 1225 07/09/18 1935 07/09/18 2014 07/10/18 0524  BP: 124/80 110/73 125/86 106/75  Pulse: 82 87 80 74  Resp: 18 16 18 18   Temp: 98.3 F (36.8 C) 98.2 F (36.8 C) 98.9 F (37.2 C) 98.8 F (37.1 C)  TempSrc: Oral Oral Oral Oral  SpO2: 97% 98% 98% 100%  Weight:      Height:        Wt Readings from Last 3 Encounters:  07/09/18 59.1 kg     Intake/Output Summary (Last 24 hours) at 07/10/2018 1205 Last data filed at 07/10/2018 0655 Gross per 24 hour  Intake 1275.42 ml  Output 1300 ml  Net -24.58 ml     Physical Exam:   GENERAL: Pleasant-appearing in no apparent distress.  HEAD, EYES, EARS, NOSE AND THROAT: Atraumatic, normocephalic. Extraocular muscles are intact. Pupils equal and reactive to light. Sclerae anicteric. No conjunctival injection. No oro-pharyngeal erythema.  NECK: Supple. There is no jugular venous distention. No bruits, no lymphadenopathy, no thyromegaly.  HEART: Regular rate and rhythm,. No murmurs, no rubs, no clicks.  LUNGS: Clear to auscultation bilaterally. No rales or rhonchi. No wheezes.  ABDOMEN: Soft, flat, nontender, nondistended. Has good bowel sounds. No hepatosplenomegaly appreciated.  EXTREMITIES: No evidence of any cyanosis, clubbing, or peripheral  edema.  +2 pedal and radial pulses bilaterally.  NEUROLOGIC: The patient is alert, awake, and oriented x3 with no focal motor or sensory deficits appreciated bilaterally.  SKIN: Moist and warm with no rashes appreciated.  Psych: Not anxious, depressed LN: No inguinal LN enlargement    Antibiotics   Anti-infectives (From admission, onward)   None      Medications   Scheduled Meds: . docusate sodium  100 mg Oral BID   Continuous Infusions: . sodium chloride 125 mL/hr at 07/10/18 0529   PRN Meds:.acetaminophen **OR** acetaminophen, morphine injection, ondansetron **OR** ondansetron (ZOFRAN) IV   Data Review:   Micro Results Recent Results (from the past 240 hour(s))  SARS Coronavirus 2 (CEPHEID - Performed in [facility] lab), Hosp Order     Status: None   Collection Time: 07/09/18  4:45 AM   Specimen: Nasopharyngeal Swab  Result Value Ref Range Status   SARS Coronavirus 2 NEGATIVE NEGATIVE Final    Comment: (NOTE) If result is NEGATIVE SARS-CoV-2 target nucleic acids are NOT DETECTED. The SARS-CoV-2 RNA is generally detectable in upper and lower  respiratory specimens during the acute phase of infection. The lowest  concentration of SARS-CoV-2 viral copies this assay can detect is  250  copies / mL. A negative result does not preclude SARS-CoV-2 infection  and should not be used as the sole basis for treatment or other  patient management decisions.  A negative result may occur with  improper specimen collection / handling, submission of specimen other  than nasopharyngeal swab, presence of viral mutation(s) within the  areas targeted by this assay, and inadequate number of viral copies  (<250 copies / mL). A negative result must be combined with clinical  observations, patient history, and epidemiological information. If result is POSITIVE SARS-CoV-2 target nucleic acids are DETECTED. The SARS-CoV-2 RNA is generally detectable in upper and lower  respiratory specimens dur ing the acute phase of infection.  Positive  results are indicative of active infection with SARS-CoV-2.  Clinical  correlation with patient history and other diagnostic information is  necessary to determine patient infection status.  Positive results do  not rule out bacterial infection or co-infection with other viruses. If result is PRESUMPTIVE POSTIVE SARS-CoV-2 nucleic acids MAY BE PRESENT.   A presumptive positive result was obtained on the submitted specimen  and confirmed on repeat testing.  While 2019 novel coronavirus  (SARS-CoV-2) nucleic acids may be present in the submitted sample  additional confirmatory testing may be necessary for epidemiological  and / or clinical management purposes  to differentiate between  SARS-CoV-2 and other Sarbecovirus currently known to infect humans.  If clinically indicated additional testing with an alternate test  methodology [phone]([id number]) is advised. The SARS-CoV-2 RNA is generally  detectable in upper and lower respiratory sp ecimens during the acute  phase of infection. The expected result is Negative. Fact Sheet for Patients:  BoilerBrush.com.cyhttps://www.fda.gov/media/136312/download Fact Sheet for Healthcare  Providers: https://pope.com/https://www.fda.gov/media/136313/download This test is not yet approved or cleared by the [place] FDA and has been authorized for detection and/or diagnosis of SARS-CoV-2 by FDA under an Emergency Use Authorization (EUA).  This EUA will remain in effect (meaning this test can be used) for the duration of the COVID-19 declaration under Section 564(b)(1) of the Act, 21 U.S.C. section 360bbb-3(b)(1), unless the authorization is terminated or revoked sooner. Performed at [facility], [address] [place], [place] [address]     Radiology Reports Mr 3d Recon At Scanner  Result Date: 07/09/2018 CLINICAL DATA:  Right upper quadrant and epigastric pain. Cholelithiasis. EXAM: MRI ABDOMEN WITHOUT AND WITH CONTRAST (INCLUDING MRCP) TECHNIQUE: Multiplanar multisequence MR imaging of the abdomen was performed both before and after the administration of intravenous contrast. Heavily T2-weighted images of the biliary and pancreatic ducts were obtained, and three-dimensional MRCP images were rendered by post processing. CONTRAST:  5 mL Gadavist COMPARISON:  Abdomen ultrasound performed earlier today. FINDINGS: Lower chest: No acute findings. Hepatobiliary: No hepatic masses identified. Numerous tiny gallstones are seen, however there is no evidence of cholecystitis. No evidence of biliary ductal dilatation, with common bile duct measuring 4 mm. No evidence of choledocholithiasis. Pancreas: No mass or inflammatory changes. No evidence of pancreatic ductal dilatation or pancreas divisum. Spleen:  Within normal limits in size and appearance. Adrenals/Urinary Tract: No masses identified. No evidence of hydronephrosis. Stomach/Bowel: Visualized portion unremarkable. Vascular/Lymphatic: No pathologically enlarged lymph nodes identified. No abdominal aortic aneurysm. Other:  None. Musculoskeletal:  No suspicious bone lesions identified. IMPRESSION: Cholelithiasis. No radiographic evidence of  cholecystitis or other acute findings. No evidence of biliary ductal dilatation or choledocholithiasis. Electronically Signed   By: Myles RosenthalJohn  Stahl M.D.   On: 07/09/2018 14:53   Mr Abdomen Mrcp Vivien RossettiW Wo Contast  Result Date: 07/09/2018 CLINICAL DATA:  Right upper quadrant and epigastric pain. Cholelithiasis. EXAM: MRI ABDOMEN WITHOUT AND WITH CONTRAST (INCLUDING MRCP) TECHNIQUE: Multiplanar multisequence MR imaging of the abdomen was performed both before and after the administration of intravenous contrast. Heavily T2-weighted images of the biliary and pancreatic ducts were obtained, and three-dimensional MRCP images were rendered by post processing. CONTRAST:  5 mL Gadavist COMPARISON:  Abdomen ultrasound performed earlier today. FINDINGS: Lower chest: No acute findings. Hepatobiliary: No hepatic masses identified. Numerous tiny gallstones are seen, however there is no evidence of cholecystitis. No evidence of biliary ductal dilatation, with common bile duct measuring 4 mm. No evidence of choledocholithiasis. Pancreas: No mass or inflammatory changes. No evidence of pancreatic ductal dilatation or pancreas divisum. Spleen:  Within normal limits in size and appearance. Adrenals/Urinary Tract: No masses identified. No evidence of hydronephrosis. Stomach/Bowel: Visualized portion unremarkable. Vascular/Lymphatic: No pathologically enlarged lymph nodes identified. No abdominal aortic aneurysm. Other:  None. Musculoskeletal:  No suspicious bone lesions identified. IMPRESSION: Cholelithiasis. No radiographic evidence of cholecystitis or other acute findings. No evidence of biliary ductal dilatation or choledocholithiasis. Electronically Signed   By: Myles RosenthalJohn  Stahl M.D.   On: 07/09/2018 14:53   [place] Abdomen Limited Ruq  Result Date: 07/09/2018 CLINICAL DATA:  Right upper quadrant and epigastric pain beginning at 6 p.m. EXAM: ULTRASOUND ABDOMEN LIMITED RIGHT UPPER QUADRANT COMPARISON:  None. FINDINGS: Gallbladder: Multiple  bile bowel layering shadowing stones are present. There is some layering fluid is well. Gallbladder wall thickness is within normal limits. There is some hyperemia in the gallbladder fossa. Shadowing stones are evident in the neck of the gallbladder and proximal common bile duct. The technologist does not document a sonographic Murphy sign. Common bile duct: Diameter: 5.3 mm, within normal limits Liver: No focal lesion identified. Within normal limits in parenchymal echogenicity. Portal vein is patent on color Doppler imaging with normal direction of blood flow towards the liver. IMPRESSION: 1. Cholelithiasis. 2. Although there is no gallbladder wall thickening, there is some hyperemia in the gallbladder bed. This could indicate early inflammatory change. 3. Stones suspected in the gallbladder neck and possibly proximal common bile duct. Electronically Signed   By: Marin Robertshristopher  Mattern M.D.   On: 07/09/2018 04:18  CBC Recent Labs  Lab 07/09/18 0049 07/10/18 0404  WBC 15.3* 6.6  HGB 12.9 12.9  HCT 37.5 38.2  PLT 330 288  MCV 87.6 89.5  MCH 30.1 30.2  MCHC 34.4 33.8  RDW 12.6 12.7  LYMPHSABS 1.4  --   MONOABS 1.3*  --   EOSABS 0.0  --   BASOSABS 0.0  --     Chemistries  Recent Labs  Lab 07/09/18 0049 07/09/18 0630 07/10/18 0404  NA 137  --  139  K 3.2*  --  4.3  CL 104  --  106  CO2 25  --  24  GLUCOSE 130*  --  97  BUN 9  --  7  CREATININE 0.67  --  0.66  CALCIUM 9.1  --  8.9  AST 366* 374* 659*  ALT 207* 212* 1,143*  ALKPHOS 105 97 153*  BILITOT 0.9 0.5 0.9   ------------------------------------------------------------------------------------------------------------------ estimated creatinine clearance is 90.4 mL/min (by C-G formula based on SCr of 0.66 mg/dL). ------------------------------------------------------------------------------------------------------------------ Recent Labs    07/09/18 0049  HGBA1C 4.8    ------------------------------------------------------------------------------------------------------------------ No results for input(s): CHOL, HDL, LDLCALC, TRIG, CHOLHDL, LDLDIRECT in the last 72 hours. ------------------------------------------------------------------------------------------------------------------ Recent Labs    07/09/18 0049  TSH 0.535   ------------------------------------------------------------------------------------------------------------------ No results for input(s): VITAMINB12, FOLATE, FERRITIN, TIBC, IRON, RETICCTPCT in the last 72 hours.  Coagulation profile No results for input(s): INR, PROTIME in the last 168 hours.  No results for input(s): DDIMER in the last 72 hours.  Cardiac Enzymes Recent Labs  Lab 07/09/18 0049  TROPONINI <0.03   ------------------------------------------------------------------------------------------------------------------ Invalid input(s): POCBNP    Assessment & Plan   This is a 28 year old female admitted for choledocholithiasis. 1.  Abdominal pain due to gallstones with possible passage of CBD stone:  MRCP shows no common bile duct stone, shows gallstone Patient LFTs are worsening plan for HIDA scan Surgery has seen the patient for possible lap cholecystectomy tomorrow   2.  Transaminitis: Elevated liver enzymes likely due to obstructing stone  Passage Tylenol level is low Hepatitis panel pending CMV, HSV, ANA, EBV has been ordered by GI   3.  Elevated lipase:  Mild pancreatitis likely due to passage of stone  4.  Hypokalemia: Potassium replaced.  5.  DVT prophylaxis: SCDs       Code Status Orders  (From admission, onward)         Start     Ordered   07/09/18 0546  Full code  Continuous     07/09/18 0545        Code Status History    This patient has a current code status but no historical code status.   Advance Care Planning Activity           Consults  gastroenterology   DVT Prophylaxis SCD  Lab Results  Component Value Date   PLT 288 07/10/2018     Time Spent in minutes   35 minutes.   Dustin Flock M.D on 07/10/2018 at 12:05 PM  Between 7am to 6pm - Pager - [phone]  After 6pm go to [email or web address] - Proofreader  [facility]   Office  [phone]

## 2018-07-10 NOTE — Consult Note (Signed)
[facility] SURGICAL CONSULTATION NOTE (initial) - cpt: 18841   HISTORY OF PRESENT ILLNESS (HPI):  28 y.o. female presented to [facility] ED on 06/16 for evaluation of abdominal pain. Patient reports the acute onset of RUQ on Sunday afternoon which she originally thought was heartburn related. She described it as a crampy burning pain in her RUQ. The pain had been progressively worsening since onset. She tried Pepto without relief. Endorses associated nausea and emesis intermittently with the pain. No reports of fever, chills, cough, congestion, CP, SOB, or bladder/bowel changes. She denied any history of similar in the past and no obvious association with eating. No previous abdominal surgeries. Work up in the ED was concerning cholelithiasis and a question of choledocholithiasis. She was admitted to the hospitalist and GI was consulted. Additional work up with MRCP showed cholelithiasis but was without evidence of choledocholithiasis nor cholecystitis. Although, She had 5x increase in her ALT level since admission. At this time, additional work up is still pending.   This morning, she reported improvement in her RUQ pain and resolution in her nausea/emesis.    Surgery is consulted by hospitalist physician Dr. Posey Pronto in this context for evaluation and management of cholelithiasis and question of cholecystitis.   PAST MEDICAL HISTORY ([facility]):  History reviewed. No pertinent past medical history.   PAST SURGICAL HISTORY ([place]):  History reviewed. No pertinent surgical history.   MEDICATIONS:  Prior to Admission medications   Not on File     ALLERGIES:  No Known Allergies   SOCIAL HISTORY:  Social History   Socioeconomic History  . Marital status: Single    Spouse name: Not on file  . Number of children: Not on file  . Years of education: Not on file  . Highest education level: Not on file  Occupational History  . Not on file  Social Needs  . Financial resource strain: Not  on file  . Food insecurity    Worry: Not on file    Inability: Not on file  . Transportation needs    Medical: Not on file    Non-medical: Not on file  Tobacco Use  . Smoking status: Current Every Day Smoker  . Smokeless tobacco: Never Used  Substance and Sexual Activity  . Alcohol use: Not on file  . Drug use: Not on file  . Sexual activity: Not on file  Lifestyle  . Physical activity    Days per week: Not on file    Minutes per session: Not on file  . Stress: Not on file  Relationships  . Social Herbalist on phone: Not on file    Gets together: Not on file    Attends religious service: Not on file    Active member of club or organization: Not on file    Attends meetings of clubs or organizations: Not on file    Relationship status: Not on file  . Intimate partner violence    Fear of current or ex partner: Not on file    Emotionally abused: Not on file    Physically abused: Not on file    Forced sexual activity: Not on file  Other Topics Concern  . Not on file  Social History Narrative  . Not on file     FAMILY HISTORY:  Family History  Problem Relation Age of Onset  . Huntington's disease Father       REVIEW OF SYSTEMS:  Review of Systems  Constitutional: Negative for chills and  fever.  HENT: Negative for congestion and sore throat.   Respiratory: Negative for cough and shortness of breath.   Cardiovascular: Negative for chest pain and palpitations.  Gastrointestinal: Positive for abdominal pain, nausea and vomiting. Negative for blood in stool and diarrhea.  Genitourinary: Negative for dysuria and urgency.  All other systems reviewed and are negative.   VITAL SIGNS:  Temp:  [98.2 F (36.8 C)-98.9 F (37.2 C)] 98.8 F (37.1 C) (06/17 0524) Pulse Rate:  [74-87] 74 (06/17 0524) Resp:  [16-18] 18 (06/17 0524) BP: (106-125)/(73-86) 106/75 (06/17 0524) SpO2:  [97 %-100 %] 100 % (06/17 0524)     Height: 5\' 4"  (162.6 cm) Weight: 59.1 kg BMI  (Calculated): 22.35   INTAKE/OUTPUT:  06/16 0701 - 06/17 0700 In: 1275.4 [P.O.:360; I.V.:915.4] Out: 1400 [Urine:1400]  PHYSICAL EXAM:  Physical Exam Vitals signs and nursing note reviewed.  Constitutional:      General: She is not in acute distress.    Appearance: She is well-developed and normal weight. She is not ill-appearing.  HENT:     Head: Normocephalic and atraumatic.  Eyes:     General: No scleral icterus.    Extraocular Movements: Extraocular movements intact.  Cardiovascular:     Rate and Rhythm: Normal rate and regular rhythm.     Heart sounds: Normal heart sounds. No murmur. No friction rub. No gallop.   Pulmonary:     Effort: Pulmonary effort is normal. No respiratory distress.     Breath sounds: Normal breath sounds. No wheezing or rhonchi.  Abdominal:     General: Abdomen is flat. There is no distension.     Palpations: Abdomen is soft.     Tenderness: There is abdominal tenderness in the right upper quadrant. There is no guarding or rebound. Negative signs include Murphy's sign.     Hernia: No hernia is present.  Genitourinary:    Comments: Deferred Skin:    General: Skin is warm and dry.     Coloration: Skin is not jaundiced or pale.  Neurological:     General: No focal deficit present.     Mental Status: She is alert and oriented to person, place, and time.  Psychiatric:        Mood and Affect: Mood normal.        Behavior: Behavior normal.      Labs:  CBC Latest Ref Rng & Units 07/10/2018 07/09/2018  WBC 4.0 - 10.5 K/uL 6.6 15.3(H)  Hemoglobin 12.0 - 15.0 g/dL 04.512.9 40.912.9  Hematocrit 81.136.0 - 46.0 % 38.2 37.5  Platelets 150 - 400 K/uL 288 330   CMP Latest Ref Rng & Units 07/10/2018 07/09/2018 07/09/2018  Glucose 70 - 99 mg/dL 97 - 914(N130(H)  BUN 6 - 20 mg/dL 7 - 9  Creatinine 8.290.44 - 1.00 mg/dL 5.620.66 - 1.300.67  Sodium 865135 - 145 mmol/L 139 - 137  Potassium 3.5 - 5.1 mmol/L 4.3 - 3.2(L)  Chloride 98 - 111 mmol/L 106 - 104  CO2 22 - 32 mmol/L 24 - 25   Calcium 8.9 - 10.3 mg/dL 8.9 - 9.1  Total Protein 6.5 - 8.1 g/dL 6.8 6.8 7.1  Total Bilirubin 0.3 - 1.2 mg/dL 0.9 0.5 0.9  Alkaline Phos 38 - 126 U/L 153(H) 97 105  AST 15 - 41 U/L 659(H) 374(H) 366(H)  ALT 0 - 44 U/L 1,143(H) 212(H) 207(H)     Imaging studies:   RUQ US (07/09/2018) personally reviewed and radiologist report reviewed:  IMPRESSION: 1. Cholelithiasis. 2.  Although there is no gallbladder wall thickening, there is some hyperemia in the gallbladder bed. This could indicate early inflammatory change. 3. Stones suspected in the gallbladder neck and possibly proximal common bile duct.  MRCP (07/09/2018) personally reviewed and radiologist report reviewed:  IMPRESSION: 1) Cholelithiasis. No radiographic evidence of cholecystitis or other acute findings. 2) No evidence of biliary ductal dilatation or choledocholithiasis.    Assessment/Plan: (ICD-10's: 40K80.20) 28 y.o. female with RUQ and elevated LFTs which may be related to cholelithiasis although radiographic work up to this point is without evidence of choledocholithiasis nor cholecystitis and there may be a question of hepatic etiology as source of LFT elevation, although pending additional work up.    - NPO + IVF  - pain control prn; antiemetics prn  - Monitor abdominal examination  - Agree with plan to obtain HIDA scan to further evaluate for gallbladder etiology; will follow up on results and possible cholecystectomy  - Appreciate GI input; evaluating for additionally etiology of AST/ALT elevation   - further management per primary team; will continue to follow   All of the above findings and recommendations were discussed with the patient, and all of patient's questions were answered to her expressed satisfaction.  Thank you for the opportunity to participate in this patient's care.   -- Lynden OxfordZachary Schulz, PA-C [facility] 07/10/2018, 9:07 AM [phone] M-F: 7am - 4pm

## 2018-07-11 ENCOUNTER — Encounter
Admission: EM | Disposition: A | Discharge status: Home / Self Care | Payer: Self-pay | Source: Home / Self Care | Attending: Specialist

## 2018-07-11 ENCOUNTER — Inpatient Hospital Stay: Payer: Medicaid - Out of State | Admitting: Anesthesiology

## 2018-07-11 ENCOUNTER — Encounter: Payer: Self-pay | Admitting: *Deleted

## 2018-07-11 HISTORY — PX: CHOLECYSTECTOMY: SHX55

## 2018-07-11 HISTORY — PX: LIVER BIOPSY: SHX301

## 2018-07-11 LAB — URINE DRUG SCREEN, QUALITATIVE (ARMC ONLY)
Amphetamines, Ur Screen: NOT DETECTED
Barbiturates, Ur Screen: NOT DETECTED
Benzodiazepine, Ur Scrn: NOT DETECTED
Cannabinoid 50 Ng, Ur ~~LOC~~: POSITIVE — AB
Cocaine Metabolite,Ur ~~LOC~~: NOT DETECTED
MDMA (Ecstasy)Ur Screen: NOT DETECTED
Methadone Scn, Ur: NOT DETECTED
Opiate, Ur Screen: NOT DETECTED
Phencyclidine (PCP) Ur S: NOT DETECTED
Tricyclic, Ur Screen: NOT DETECTED

## 2018-07-11 LAB — BASIC METABOLIC PANEL
Anion gap: 7 (ref 5–15)
BUN: 7 mg/dL (ref 6–20)
CO2: 24 mmol/L (ref 22–32)
Calcium: 9 mg/dL (ref 8.9–10.3)
Chloride: 108 mmol/L (ref 98–111)
Creatinine, Ser: 0.65 mg/dL (ref 0.44–1.00)
GFR calc Af Amer: >60 mL/min (ref >60)
GFR calc non Af Amer: >60 mL/min (ref >60)
Glucose, Bld: 98 mg/dL (ref 70–99)
Potassium: 4.3 mmol/L (ref 3.5–5.1)
Sodium: 139 mmol/L (ref 135–145)

## 2018-07-11 LAB — HIV ANTIBODY (ROUTINE TESTING W REFLEX): HIV Screen 4th Generation wRfx: NONREACTIVE

## 2018-07-11 LAB — CBC
HCT: 38.4 % (ref 36.0–46.0)
Hemoglobin: 13 g/dL (ref 12.0–15.0)
MCH: 29.8 pg (ref 26.0–34.0)
MCHC: 33.9 g/dL (ref 30.0–36.0)
MCV: 88.1 fL (ref 80.0–100.0)
Platelets: 284 10*3/uL (ref 150–400)
RBC: 4.36 MIL/uL (ref 3.87–5.11)
RDW: 12.7 % (ref 11.5–15.5)
WBC: 6.6 10*3/uL (ref 4.0–10.5)
nRBC: 0 % (ref 0.0–0.2)

## 2018-07-11 LAB — HEPATIC FUNCTION PANEL
ALT: 650 U/L — ABNORMAL HIGH (ref 0–44)
AST: 129 U/L — ABNORMAL HIGH (ref 15–41)
Albumin: 3.8 g/dL (ref 3.5–5.0)
Alkaline Phosphatase: 132 U/L — ABNORMAL HIGH (ref 38–126)
Bilirubin, Direct: 0.2 mg/dL (ref 0.0–0.2)
Indirect Bilirubin: 0.5 mg/dL (ref 0.3–0.9)
Total Bilirubin: 0.7 mg/dL (ref 0.3–1.2)
Total Protein: 6.7 g/dL (ref 6.5–8.1)

## 2018-07-11 LAB — HEPATITIS PANEL, ACUTE
HCV Ab: <0.1 s/co ratio (ref 0.0–0.9)
Hep A IgM: NEGATIVE
Hep B C IgM: NEGATIVE
Hepatitis B Surface Ag: NEGATIVE

## 2018-07-11 LAB — CERULOPLASMIN: Ceruloplasmin: 19 mg/dL (ref 19.0–39.0)

## 2018-07-11 LAB — SURGICAL PCR SCREEN
MRSA, PCR: NEGATIVE
Staphylococcus aureus: POSITIVE — AB

## 2018-07-11 LAB — HSV 1 AND 2 IGM ABS, INDIRECT
HSV 1 IgM Antibodies: <1:10 {titer}
HSV 2 IgM Antibodies: <1:10 {titer}

## 2018-07-11 LAB — CMV IGM: CMV IgM: <30 AU/mL (ref 0.0–29.9)

## 2018-07-11 LAB — EPSTEIN-BARR VIRUS VCA, IGM: EBV VCA IgM: <36 U/mL (ref 0.0–35.9)

## 2018-07-11 SURGERY — LAPAROSCOPIC CHOLECYSTECTOMY
Anesthesia: General

## 2018-07-11 MED ORDER — DEXAMETHASONE SODIUM PHOSPHATE 10 MG/ML IJ SOLN
INTRAMUSCULAR | Status: AC
  Filled 2018-07-11: qty 1

## 2018-07-11 MED ORDER — MUPIROCIN 2 % EX OINT
1.0000 "application " | TOPICAL_OINTMENT | Freq: Two times a day (BID) | CUTANEOUS | Status: DC
  Administered 2018-07-11 – 2018-07-12 (×2): 1 via NASAL
  Filled 2018-07-11: qty 22

## 2018-07-11 MED ORDER — ONDANSETRON HCL 4 MG/2ML IJ SOLN
INTRAMUSCULAR | Status: AC
  Filled 2018-07-11: qty 2

## 2018-07-11 MED ORDER — BUPIVACAINE-EPINEPHRINE (PF) 0.25% -1:200000 IJ SOLN
INTRAMUSCULAR | Status: AC
  Filled 2018-07-11: qty 30

## 2018-07-11 MED ORDER — PROMETHAZINE HCL 25 MG/ML IJ SOLN: 6.2500 mg | INTRAMUSCULAR | Status: DC | PRN

## 2018-07-11 MED ORDER — HYDROMORPHONE HCL 1 MG/ML IJ SOLN
0.5000 mg | INTRAMUSCULAR | Status: AC | PRN
  Administered 2018-07-11 (×4): 0.5 mg via INTRAVENOUS

## 2018-07-11 MED ORDER — MEPERIDINE HCL 50 MG/ML IJ SOLN: 6.2500 mg | INTRAMUSCULAR | Status: DC | PRN

## 2018-07-11 MED ORDER — GABAPENTIN 600 MG PO TABS
300.0000 mg | ORAL_TABLET | Freq: Three times a day (TID) | ORAL | Status: DC
  Administered 2018-07-11 – 2018-07-12 (×3): 300 mg via ORAL
  Filled 2018-07-11 (×3): qty 1

## 2018-07-11 MED ORDER — SUCCINYLCHOLINE CHLORIDE 20 MG/ML IJ SOLN
INTRAMUSCULAR | Status: DC | PRN
  Administered 2018-07-11: 100 mg via INTRAVENOUS

## 2018-07-11 MED ORDER — SUGAMMADEX SODIUM 200 MG/2ML IV SOLN
INTRAVENOUS | Status: DC | PRN
  Administered 2018-07-11: 200 mg via INTRAVENOUS

## 2018-07-11 MED ORDER — FENTANYL CITRATE (PF) 100 MCG/2ML IJ SOLN: 25.0000 ug | INTRAMUSCULAR | Status: DC | PRN

## 2018-07-11 MED ORDER — CEFAZOLIN SODIUM 1 G IJ SOLR
INTRAMUSCULAR | Status: AC
  Filled 2018-07-11: qty 20

## 2018-07-11 MED ORDER — SUCCINYLCHOLINE CHLORIDE 20 MG/ML IJ SOLN
INTRAMUSCULAR | Status: AC
  Filled 2018-07-11: qty 1

## 2018-07-11 MED ORDER — DEXAMETHASONE SODIUM PHOSPHATE 10 MG/ML IJ SOLN
INTRAMUSCULAR | Status: DC | PRN
  Administered 2018-07-11: 10 mg via INTRAVENOUS

## 2018-07-11 MED ORDER — ACETAMINOPHEN 10 MG/ML IV SOLN
INTRAVENOUS | Status: DC | PRN
  Administered 2018-07-11: 1000 mg via INTRAVENOUS

## 2018-07-11 MED ORDER — KETOROLAC TROMETHAMINE 30 MG/ML IJ SOLN
30.0000 mg | Freq: Four times a day (QID) | INTRAMUSCULAR | Status: DC
  Administered 2018-07-11 – 2018-07-12 (×3): 30 mg via INTRAVENOUS
  Filled 2018-07-11 (×3): qty 1

## 2018-07-11 MED ORDER — FENTANYL CITRATE (PF) 250 MCG/5ML IJ SOLN
INTRAMUSCULAR | Status: AC
  Filled 2018-07-11: qty 5

## 2018-07-11 MED ORDER — HYDROCODONE-ACETAMINOPHEN 7.5-325 MG PO TABS
1.0000 | ORAL_TABLET | Freq: Once | ORAL | Status: DC | PRN
  Filled 2018-07-11: qty 1

## 2018-07-11 MED ORDER — LIDOCAINE HCL (CARDIAC) PF 100 MG/5ML IV SOSY
PREFILLED_SYRINGE | INTRAVENOUS | Status: DC | PRN
  Administered 2018-07-11: 60 mg via INTRAVENOUS

## 2018-07-11 MED ORDER — PROPOFOL 10 MG/ML IV BOLUS
INTRAVENOUS | Status: AC
  Filled 2018-07-11: qty 40

## 2018-07-11 MED ORDER — ONDANSETRON HCL 4 MG/2ML IJ SOLN
INTRAMUSCULAR | Status: DC | PRN
  Administered 2018-07-11: 4 mg via INTRAVENOUS

## 2018-07-11 MED ORDER — SUGAMMADEX SODIUM 200 MG/2ML IV SOLN
INTRAVENOUS | Status: AC
  Filled 2018-07-11: qty 2

## 2018-07-11 MED ORDER — KETOROLAC TROMETHAMINE 30 MG/ML IJ SOLN
INTRAMUSCULAR | Status: AC
  Filled 2018-07-11: qty 1

## 2018-07-11 MED ORDER — BUPIVACAINE-EPINEPHRINE 0.25% -1:200000 IJ SOLN
INTRAMUSCULAR | Status: DC | PRN
  Administered 2018-07-11: 30 mL

## 2018-07-11 MED ORDER — HYDROMORPHONE HCL 1 MG/ML IJ SOLN
INTRAMUSCULAR | Status: AC
  Filled 2018-07-11: qty 1

## 2018-07-11 MED ORDER — ACETAMINOPHEN 325 MG PO TABS: 325.0000 mg | ORAL_TABLET | ORAL | Status: DC | PRN

## 2018-07-11 MED ORDER — LIDOCAINE HCL (PF) 2 % IJ SOLN
INTRAMUSCULAR | Status: AC
  Filled 2018-07-11: qty 10

## 2018-07-11 MED ORDER — MIDAZOLAM HCL 2 MG/2ML IJ SOLN
INTRAMUSCULAR | Status: DC | PRN
  Administered 2018-07-11: 2 mg via INTRAVENOUS

## 2018-07-11 MED ORDER — PROPOFOL 10 MG/ML IV BOLUS
INTRAVENOUS | Status: DC | PRN
  Administered 2018-07-11: 160 mg via INTRAVENOUS

## 2018-07-11 MED ORDER — KETOROLAC TROMETHAMINE 30 MG/ML IJ SOLN
INTRAMUSCULAR | Status: DC | PRN
  Administered 2018-07-11: 30 mg via INTRAVENOUS

## 2018-07-11 MED ORDER — FENTANYL CITRATE (PF) 100 MCG/2ML IJ SOLN
INTRAMUSCULAR | Status: DC | PRN
  Administered 2018-07-11 (×3): 50 ug via INTRAVENOUS
  Administered 2018-07-11 (×2): 100 ug via INTRAVENOUS

## 2018-07-11 MED ORDER — ACETAMINOPHEN 10 MG/ML IV SOLN
INTRAVENOUS | Status: AC
  Filled 2018-07-11: qty 100

## 2018-07-11 MED ORDER — MIDAZOLAM HCL 2 MG/2ML IJ SOLN: INTRAMUSCULAR | Status: DC | PRN

## 2018-07-11 MED ORDER — ACETAMINOPHEN 160 MG/5ML PO SOLN
325.0000 mg | ORAL | Status: DC | PRN
  Filled 2018-07-11: qty 20.3

## 2018-07-11 MED ORDER — FENTANYL CITRATE (PF) 100 MCG/2ML IJ SOLN
INTRAMUSCULAR | Status: AC
  Filled 2018-07-11: qty 2

## 2018-07-11 MED ORDER — OXYCODONE-ACETAMINOPHEN 5-325 MG PO TABS
2.0000 | ORAL_TABLET | ORAL | Status: DC | PRN
  Administered 2018-07-11 – 2018-07-12 (×4): 2 via ORAL
  Filled 2018-07-11 (×4): qty 2

## 2018-07-11 MED ORDER — CHLORHEXIDINE GLUCONATE CLOTH 2 % EX PADS
6.0000 | MEDICATED_PAD | Freq: Every day | CUTANEOUS | Status: DC
  Administered 2018-07-12: 10:00:00 6 via TOPICAL

## 2018-07-11 MED ORDER — MIDAZOLAM HCL 2 MG/2ML IJ SOLN
INTRAMUSCULAR | Status: AC
  Filled 2018-07-11: qty 2

## 2018-07-11 MED ORDER — ROCURONIUM BROMIDE 50 MG/5ML IV SOLN
INTRAVENOUS | Status: AC
  Filled 2018-07-11: qty 1

## 2018-07-11 MED ORDER — ROCURONIUM BROMIDE 100 MG/10ML IV SOLN
INTRAVENOUS | Status: DC | PRN
  Administered 2018-07-11: 25 mg via INTRAVENOUS
  Administered 2018-07-11: 5 mg via INTRAVENOUS
  Administered 2018-07-11: 10 mg via INTRAVENOUS

## 2018-07-11 MED ORDER — DEXMEDETOMIDINE HCL IN NACL 200 MCG/50ML IV SOLN
INTRAVENOUS | Status: DC | PRN
  Administered 2018-07-11 (×2): 8 ug via INTRAVENOUS

## 2018-07-11 SURGICAL SUPPLY — 41 items
APPLICATOR COTTON TIP 6 STRL (MISCELLANEOUS) IMPLANT
APPLICATOR COTTON TIP 6IN STRL (MISCELLANEOUS)
APPLIER CLIP 5 13 M/L LIGAMAX5 (MISCELLANEOUS) ×3
CANISTER SUCT 1200ML W/VALVE (MISCELLANEOUS) ×3 IMPLANT
CHLORAPREP W/TINT 26 (MISCELLANEOUS) ×3 IMPLANT
CHOLANGIOGRAM CATH TAUT (CATHETERS) IMPLANT
CLIP APPLIE 5 13 M/L LIGAMAX5 (MISCELLANEOUS) ×1 IMPLANT
COVER WAND RF STERILE (DRAPES) ×3 IMPLANT
DECANTER SPIKE VIAL GLASS SM (MISCELLANEOUS) IMPLANT
DERMABOND ADVANCED (GAUZE/BANDAGES/DRESSINGS) ×2
DERMABOND ADVANCED .7 DNX12 (GAUZE/BANDAGES/DRESSINGS) ×1 IMPLANT
DRAPE C-ARM XRAY 36X54 (DRAPES) IMPLANT
ELECT CAUTERY BLADE 6.4 (BLADE) ×3 IMPLANT
ELECT REM PT RETURN 9FT ADLT (ELECTROSURGICAL) ×3
ELECTRODE REM PT RTRN 9FT ADLT (ELECTROSURGICAL) ×1 IMPLANT
GLOVE BIO SURGEON STRL SZ7 (GLOVE) ×3 IMPLANT
GOWN STRL REUS W/ TWL LRG LVL3 (GOWN DISPOSABLE) ×3 IMPLANT
GOWN STRL REUS W/TWL LRG LVL3 (GOWN DISPOSABLE) ×6
IRRIGATION STRYKERFLOW (MISCELLANEOUS) ×1 IMPLANT
IRRIGATOR STRYKERFLOW (MISCELLANEOUS) ×3
IV CATH ANGIO 12GX3 LT BLUE (NEEDLE) IMPLANT
IV NS 1000ML (IV SOLUTION) ×2
IV NS 1000ML BAXH (IV SOLUTION) ×1 IMPLANT
L-HOOK LAP DISP 36CM (ELECTROSURGICAL) ×3
LHOOK LAP DISP 36CM (ELECTROSURGICAL) ×1 IMPLANT
NEEDLE HYPO 22GX1.5 SAFETY (NEEDLE) ×3 IMPLANT
NS IRRIG 500ML POUR BTL (IV SOLUTION) ×3 IMPLANT
PACK LAP CHOLECYSTECTOMY (MISCELLANEOUS) ×3 IMPLANT
PENCIL ELECTRO HAND CTR (MISCELLANEOUS) ×3 IMPLANT
POUCH SPECIMEN RETRIEVAL 10MM (ENDOMECHANICALS) ×3 IMPLANT
SCISSORS METZENBAUM CVD 33 (INSTRUMENTS) ×3 IMPLANT
SET TUBE SMOKE EVAC HIGH FLOW (TUBING) ×3 IMPLANT
SLEEVE ENDOPATH XCEL 5M (ENDOMECHANICALS) ×6 IMPLANT
SPONGE LAP 18X18 RF (DISPOSABLE) ×3 IMPLANT
STOPCOCK 4 WAY LG BORE MALE ST (IV SETS) IMPLANT
SUT ETHIBOND 0 MO6 C/R (SUTURE) IMPLANT
SUT MNCRL AB 4-0 PS2 18 (SUTURE) ×6 IMPLANT
SUT VICRYL 0 AB UR-6 (SUTURE) ×6 IMPLANT
SYR 20CC LL (SYRINGE) ×3 IMPLANT
TROCAR XCEL BLUNT TIP 100MML (ENDOMECHANICALS) ×3 IMPLANT
TROCAR XCEL NON-BLD 5MMX100MML (ENDOMECHANICALS) ×3 IMPLANT

## 2018-07-11 NOTE — Progress Notes (Signed)
[facility] [place] NAME: Lorraine Thompson    MR#:  [id number]  DATE OF BIRTH:  10/23/1990  SUBJECTIVE:   Patient's HIDA scan overnight was positive for acute cholecystitis.  General surgery planning for laparoscopic cholecystectomy today.  Patient still having some right upper quadrant abdominal pain.  REVIEW OF SYSTEMS:    Review of Systems  Constitutional: Negative for chills and fever.  HENT: Negative for congestion and tinnitus.   Eyes: Negative for blurred vision and double vision.  Respiratory: Negative for cough, shortness of breath and wheezing.   Cardiovascular: Negative for chest pain, orthopnea and PND.  Gastrointestinal: Positive for abdominal pain (RUQ). Negative for diarrhea, nausea and vomiting.  Genitourinary: Negative for dysuria and hematuria.  Neurological: Negative for dizziness, sensory change and focal weakness.  All other systems reviewed and are negative.   Nutrition: Full liquids Tolerating Diet: Yes Tolerating PT: Await Eval.   DRUG ALLERGIES:  No Known Allergies  VITALS:  Blood pressure 115/88, pulse 76, temperature 97.9 F (36.6 C), temperature source Oral, resp. rate 16, height 5\' 4"  (1.626 m), weight 56.6 kg, last menstrual period 07/09/2018, SpO2 98 %.  PHYSICAL EXAMINATION:   Physical Exam  GENERAL:  28 y.o.-year-old patient lying in bed in no acute distress.  EYES: Pupils equal, round, reactive to light and accommodation. No scleral icterus. Extraocular muscles intact.  HEENT: Head atraumatic, normocephalic. Oropharynx and nasopharynx clear.  NECK:  Supple, no jugular venous distention. No thyroid enlargement, no tenderness.  LUNGS: Normal breath sounds bilaterally, no wheezing, rales, rhonchi. No use of accessory muscles of respiration.  CARDIOVASCULAR: S1, S2 normal. No murmurs, rubs, or gallops.  ABDOMEN: Soft, Tender in RUQ, nondistended. Bowel sounds present. No organomegaly or mass  EXTREMITIES: No cyanosis, clubbing or edema b/l.    NEUROLOGIC: Cranial nerves II through XII are intact. No focal Motor or sensory deficits b/l.    PSYCHIATRIC: The patient is alert and oriented x 3.  SKIN: No obvious rash, lesion, or ulcer.    LABORATORY PANEL:   CBC Recent Labs  Lab 07/11/18 0406  WBC 6.6  HGB 13.0  HCT 38.4  PLT 284   ------------------------------------------------------------------------------------------------------------------  Chemistries  Recent Labs  Lab 07/11/18 0406  NA 139  K 4.3  CL 108  CO2 24  GLUCOSE 98  BUN 7  CREATININE 0.65  CALCIUM 9.0  AST 129*  ALT 650*  ALKPHOS 132*  BILITOT 0.7   ------------------------------------------------------------------------------------------------------------------  Cardiac Enzymes Recent Labs  Lab 07/09/18 0049  TROPONINI <0.03   ------------------------------------------------------------------------------------------------------------------  RADIOLOGY:  Nm Hepatobiliary Liver Func  Result Date: 07/10/2018 CLINICAL DATA:  Cholelithiasis. EXAM: NUCLEAR MEDICINE HEPATOBILIARY IMAGING TECHNIQUE: Sequential images of the abdomen were obtained out to 60 minutes following intravenous administration of radiopharmaceutical. RADIOPHARMACEUTICALS:  5.3 mCi Tc-59m  Choletec IV COMPARISON:  MRI 07/09/2018. FINDINGS: Normal liver biliary and bowel activity noted. No gallbladder visualized at 1 hour. 3 hour delayed images obtained. Gallbladder is not visualized at 3 hours. Cholecystitis could present in this fashion. IMPRESSION: Nonvisualization of the gallbladder at 1 and 3 hours. Cholecystitis could present in this fashion. Electronically Signed   By: Marcello Moores  Register   On: 07/10/2018 15:31     ASSESSMENT AND PLAN:   28 year old female with no significant past medical history admitted to the hospital secondary abdominal pain and noted to have biliary colic.  1.  Acute cholecystitis-this is a source  of patient's abdominal pain.  Patient's HIDA scan overnight  was positive. - This post laparoscopic cholecystectomy done by general surgery today.  Continue pain control and further care as per general surgery.  2.  Elevated LFTs/transaminitis-secondary to #1. -Seen by gastroenterology and some serologic work-up initiated but results are still pending. - Hepatitis serologies are negative. -We will continue to follow LFTs.  Liver biopsy done intraoperatively today.  3. Hypokalemia - improved and resolved with supplementation.     All the records are reviewed and case discussed with Care Management/Social Worker. Management plans discussed with the patient, family and they are in agreement.  CODE STATUS: Full code  DVT Prophylaxis: ted's & SCD's.   TOTAL TIME TAKING CARE OF THIS PATIENT: 30 minutes.   POSSIBLE D/C IN 1-2 DAYS, DEPENDING ON CLINICAL CONDITION.   Houston SirenVivek J Lamir Racca M.D on 07/11/2018 at 2:31 PM  Between 7am to 6pm - Pager - [phone]  After 6pm go to [email or web address] - Social research officer, governmentpassword [email or web address]  [facility]  Office  [phone]  CC: Primary care physician; Patient, No Pcp Per

## 2018-07-11 NOTE — Progress Notes (Signed)
Notified Dr. Dahlia Byes of minimally relieved pain after morphine 2-mg x2 doses, also of edema to periumbilical area.  New orders received.

## 2018-07-11 NOTE — Op Note (Signed)
Laparoscopic Cholecystectomy  Pre-operative Diagnosis: cholecystitis  Post-operative Diagnosis: same  Procedure: Laparoscopic cholecystectomy with liver biopsy  Surgeon: Caroleen Hamman, MD FACS  Anesthesia: Gen. with endotracheal tube  Assistant: Mr. Elissa Hefty PA-C required for holding the camera and due to the lack of a qualified first assistant   Findings: Cholecystitis  No cirrhosis  Estimated Blood Loss: 5cc                 Specimens: Gallbladder, liver wedge           Complications: none   Procedure Details  The patient was seen again in the Holding Room. The benefits, complications, treatment options, and expected outcomes were discussed with the patient. The risks of bleeding, infection, recurrence of symptoms, failure to resolve symptoms, bile duct damage, bile duct leak, retained common bile duct stone, bowel injury, any of which could require further surgery and/or ERCP, stent, or papillotomy were reviewed with the patient. The likelihood of improving the patient's symptoms with return to their baseline status is good.  The patient and/or family concurred with the proposed plan, giving informed consent.  The patient was taken to Operating Room, identified as Lorraine Thompson and the procedure verified as Laparoscopic Cholecystectomy.  A Time Out was held and the above information confirmed.  Prior to the induction of general anesthesia, antibiotic prophylaxis was administered. VTE prophylaxis was in place. General endotracheal anesthesia was then administered and tolerated well. After the induction, the abdomen was prepped with Chloraprep and draped in the sterile fashion. The patient was positioned in the supine position.  Cut down technique was used to enter the abdominal cavity and a Hasson trochar was placed after two vicryl stitches were anchored to the fascia. Pneumoperitoneum was then created with CO2 and tolerated well without any adverse changes in the patient's vital  signs.  Three 5-mm ports were placed in the right upper quadrant all under direct vision. All skin incisions  were infiltrated with a local anesthetic agent before making the incision and placing the trocars.   The patient was positioned  in reverse Trendelenburg, tilted slightly to the patient's left.  The gallbladder was identified, the fundus grasped and retracted cephalad. Adhesions were lysed bluntly. The infundibulum was grasped and retracted laterally, exposing the peritoneum overlying the triangle of Calot. This was then divided and exposed in a blunt fashion. An extended critical view of the cystic duct and cystic artery was obtained.  The cystic duct was clearly identified and bluntly dissected.   Artery and duct were double clipped and divided.  The gallbladder was taken from the gallbladder fossa in a retrograde fashion with the electrocautery. The gallbladder was removed and placed in an Endocatch bag. Attention then was turned to the liver where a wedge biopsy next to the gallbladder fossa was taken with a combination of cutting and electrocautery.  There was no evidence of any bleeding or bile leak.   The liver bed was irrigated and inspected. Hemostasis was achieved with the electrocautery. Copious irrigation was utilized and was repeatedly aspirated until clear.  The gallbladder and Endocatch sac were then removed through a port site.   Inspection of the right upper quadrant was performed. No bleeding, bile duct injury or leak, or bowel injury was noted. Pneumoperitoneum was released.  The periumbilical port site was closed with interrumpted 0 Vicryl sutures. 4-0 subcuticular Monocryl was used to close the skin. Dermabond was  applied.  The patient was then extubated and brought to the recovery room in  stable condition. Sponge, lap, and needle counts were correct at closure and at the conclusion of the case.               Caroleen Hamman, MD, FACS

## 2018-07-11 NOTE — Transfer of Care (Signed)
Immediate Anesthesia Transfer of Care Note  Patient: Lorraine Thompson  Procedure(s) Performed: LAPAROSCOPIC CHOLECYSTECTOMY (N/A ) LIVER BIOPSY (N/A )  Patient Location: PACU  Anesthesia Type:General  Level of Consciousness: awake  Airway & Oxygen Therapy: Patient Spontanous Breathing and Patient connected to nasal cannula oxygen  Post-op Assessment: Report given to RN and Post -op Vital signs reviewed and stable  Post vital signs: stable  Last Vitals:  Vitals Value Taken Time  BP 136/109 07/11/18 1245  Temp    Pulse 91 07/11/18 1251  Resp 13 07/11/18 1251  SpO2 99 % 07/11/18 1251  Vitals shown include unvalidated device data.  Last Pain:  Vitals:   07/11/18 0941  TempSrc: Temporal  PainSc: 0-No pain      Patients Stated Pain Goal: 0 (24/58/09 9833)  Complications: No apparent anesthesia complications

## 2018-07-11 NOTE — Anesthesia Post-op Follow-up Note (Signed)
Anesthesia QCDR form completed.        

## 2018-07-11 NOTE — Plan of Care (Signed)
Patient is tolerating full liquid diet well and is passing gas and ambulating.  Would love to have diet advanced.

## 2018-07-11 NOTE — Anesthesia Procedure Notes (Signed)
Procedure Name: Intubation Date/Time: 07/11/2018 11:42 AM Performed by: Dionne Bucy, CRNA Pre-anesthesia Checklist: Patient identified, Patient being monitored, Timeout performed, Emergency Drugs available and Suction available Patient Re-evaluated:Patient Re-evaluated prior to induction Oxygen Delivery Method: Circle system utilized Preoxygenation: Pre-oxygenation with 100% oxygen Induction Type: IV induction Ventilation: Mask ventilation without difficulty Laryngoscope Size: Mac and 3 Grade View: Grade I Tube type: Oral Tube size: 7.0 mm Number of attempts: 1 Airway Equipment and Method: Stylet Placement Confirmation: ETT inserted through vocal cords under direct vision,  positive ETCO2 and breath sounds checked- equal and bilateral Secured at: 22 cm Tube secured with: Tape Dental Injury: Teeth and Oropharynx as per pre-operative assessment

## 2018-07-11 NOTE — Anesthesia Preprocedure Evaluation (Addendum)
Anesthesia Evaluation  Patient identified by MRN, date of birth, ID band Patient awake    Reviewed: Allergy & Precautions, H&P , NPO status , reviewed documented beta blocker date and time   Airway Mallampati: II  TM Distance: >3 FB Neck ROM: full    Dental  (+) Chipped   Pulmonary Current Smoker,    Pulmonary exam normal        Cardiovascular Normal cardiovascular exam     Neuro/Psych    GI/Hepatic   Endo/Other    Renal/GU      Musculoskeletal   Abdominal   Peds  Hematology   Anesthesia Other Findings Past Medical History: Smoker Hx some drug use, denies recent, UDS pos for THC otherwise neg Past Surgical History: No date: NO PAST SURGERIES BMI    Body Mass Index: 21.42 kg/m     Reproductive/Obstetrics                           Anesthesia Physical Anesthesia Plan  ASA: II  Anesthesia Plan: General   Post-op Pain Management:    Induction: Intravenous  PONV Risk Score and Plan: 3 and Ondansetron, Dexamethasone, Midazolam and Treatment may vary due to age or medical condition  Airway Management Planned: Oral ETT  Additional Equipment:   Intra-op Plan:   Post-operative Plan: Extubation in OR  Informed Consent: I have reviewed the patients History and Physical, chart, labs and discussed the procedure including the risks, benefits and alternatives for the proposed anesthesia with the patient or authorized representative who has indicated his/her understanding and acceptance.     Dental Advisory Given  Plan Discussed with: CRNA  Anesthesia Plan Comments:         Anesthesia Quick Evaluation

## 2018-07-11 NOTE — Progress Notes (Signed)
[facility] Day(s): 2.   Post op day(s):  Marland Kitchen   Interval History: Patient seen and examined, no acute events or new complaints overnight. Patient reports that her RUQ abdominal pain has improved. HIDA scan yesterday consistent with acute cholecystitis. No fever, chills, nausea, emesis. Plan for laparoscopic cholecystectomy today.    Vital signs in last 24 hours: [min-max] current  Temp:  [97.7 F (36.5 C)-98.5 F (36.9 C)] 97.7 F (36.5 C) (06/18 0534) Pulse Rate:  [72-82] 72 (06/18 0534) Resp:  [16-17] 16 (06/18 0534) BP: (107-115)/(77-86) 115/77 (06/18 0534) SpO2:  [99 %-100 %] 100 % (06/18 0534) Weight:  [56.6 kg] 56.6 kg (06/18 0534)     Height: 5\' 4"  (162.6 cm) Weight: 56.6 kg BMI (Calculated): 21.41   Intake/Output last 2 shifts:  06/17 0701 - 06/18 0700 In: 4219.7 [I.V.:4219.7] Out: 1650 [Urine:1650]   Physical Exam:  Constitutional: alert, cooperative and no distress  Respiratory: breathing non-labored at rest  Cardiovascular: regular rate and sinus rhythm  Gastrointestinal: soft, non-tender, and non-distended, no rebound/guarding   Labs:  CBC Latest Ref Rng & Units 07/11/2018 07/10/2018 07/09/2018  WBC 4.0 - 10.5 K/uL 6.6 6.6 15.3(H)  Hemoglobin 12.0 - 15.0 g/dL 13.0 12.9 12.9  Hematocrit 36.0 - 46.0 % 38.4 38.2 37.5  Platelets 150 - 400 K/uL 284 288 330   CMP Latest Ref Rng & Units 07/11/2018 07/10/2018 07/09/2018  Glucose 70 - 99 mg/dL 98 97 -  BUN 6 - 20 mg/dL 7 7 -  Creatinine 0.44 - 1.00 mg/dL 0.65 0.66 -  Sodium 135 - 145 mmol/L 139 139 -  Potassium 3.5 - 5.1 mmol/L 4.3 4.3 -  Chloride 98 - 111 mmol/L 108 106 -  CO2 22 - 32 mmol/L 24 24 -  Calcium 8.9 - 10.3 mg/dL 9.0 8.9 -  Total Protein 6.5 - 8.1 g/dL 6.7 6.8 6.8  Total Bilirubin 0.3 - 1.2 mg/dL 0.7 0.9 0.5  Alkaline Phos 38 - 126 U/L 132(H) 153(H) 97  AST 15 - 41 U/L 129(H) 659(H) 374(H)  ALT 0 - 44 U/L 650(H) 1,143(H) 212(H)     Imaging studies: No  new pertinent imaging studies   Assessment/Plan: (ICD-10's: K81.00) 28 y.o. female with RUQ abdominal pain attributable to acute cholecystitis seen on HIDA scan yesterday who is scheduled for laparoscopic cholecystectomy today   - NPO for surgery  - Continue IVF + Abx for surgical prophylaxis (Ancef)  - pain control prn; antiemetics prn  - Monitor abdominal examination  - Plan for laparoscopic cholecystectomy with Dr Dahlia Byes today pending OR anesthesia availability  - All risks, benefits, and alternatives to above procedure(s) were discussed with the patient, all of her questions were answered to her expressed satisfaction, patient expresses she wishes to proceed, and informed consent was obtained.  - Further management per primary team    All of the above findings and recommendations were discussed with the patient, and the medical team, and all of patient's questions were answered to her expressed satisfaction.  -- Edison Simon, PA-C [facility] 07/11/2018, 7:38 AM [phone] M-F: 7am - 4pm

## 2018-07-11 NOTE — Progress Notes (Signed)
Spoke with Zollie Scale, to provide update.  [phone]

## 2018-07-12 ENCOUNTER — Encounter: Payer: Self-pay | Admitting: Surgery

## 2018-07-12 LAB — COMPREHENSIVE METABOLIC PANEL
ALT: 345 U/L — ABNORMAL HIGH (ref 0–44)
AST: 47 U/L — ABNORMAL HIGH (ref 15–41)
Albumin: 3.5 g/dL (ref 3.5–5.0)
Alkaline Phosphatase: 100 U/L (ref 38–126)
Anion gap: 8 (ref 5–15)
BUN: 5 mg/dL — ABNORMAL LOW (ref 6–20)
CO2: 24 mmol/L (ref 22–32)
Calcium: 8.9 mg/dL (ref 8.9–10.3)
Chloride: 107 mmol/L (ref 98–111)
Creatinine, Ser: 0.74 mg/dL (ref 0.44–1.00)
GFR calc Af Amer: >60 mL/min (ref >60)
GFR calc non Af Amer: >60 mL/min (ref >60)
Glucose, Bld: 103 mg/dL — ABNORMAL HIGH (ref 70–99)
Potassium: 4.4 mmol/L (ref 3.5–5.1)
Sodium: 139 mmol/L (ref 135–145)
Total Bilirubin: 0.5 mg/dL (ref 0.3–1.2)
Total Protein: 6.2 g/dL — ABNORMAL LOW (ref 6.5–8.1)

## 2018-07-12 LAB — ANA W/REFLEX IF POSITIVE: Anti Nuclear Antibody (ANA): NEGATIVE

## 2018-07-12 LAB — SURGICAL PATHOLOGY

## 2018-07-12 MED ORDER — OXYCODONE-ACETAMINOPHEN 5-325 MG PO TABS: 1.0000 | ORAL_TABLET | ORAL | 0 refills | Status: AC | PRN

## 2018-07-12 NOTE — Discharge Summary (Signed)
[facility] NAME: Lorraine Thompson    MR#:  [id number]  DATE OF BIRTH:  [birth year]  DATE OF ADMISSION:  07/09/2018 ADMITTING PHYSICIAN: Harrie Foreman, MD  DATE OF DISCHARGE: 07/12/2018  PRIMARY CARE PHYSICIAN: Patient, No Pcp Per    ADMISSION DIAGNOSIS:  Choledocholithiasis [K80.50] RUQ pain [R10.11] Calculus of gallbladder with biliary obstruction but without cholecystitis [K80.21]  DISCHARGE DIAGNOSIS:  Active Problems:   Choledocholithiasis   SECONDARY DIAGNOSIS:   Past Medical History:  Diagnosis Date  . Medical history non-contributory     HOSPITAL COURSE:   28 year old female with no significant past medical history admitted to the hospital secondary abdominal pain and noted to have biliary colic.  1.  Acute cholecystitis-this was the source of patient's abdominal pain.  Patient's HIDA scan was + for suspected acute cholecystitis.  -Patient was seen by general surgery and is status post laparoscopic cholecystectomy postop day #1 today. -Pain is well controlled on oral Percocet.  Patient is tolerating p.o. and passing flatus and discussed with surgery and stable for discharge today with outpatient follow-up in 2 weeks.  2.  Elevated LFTs/transaminitis-secondary to #1. -Much improved and is at baseline now. -Hepatitis serologies were negative.  Further serologic work-up can be followed up as an outpatient.  3. Hypokalemia - improved and resolved.   DISCHARGE CONDITIONS:   Stable.   CONSULTS OBTAINED:  Treatment Team:  Lucilla Lame, MD Lin Landsman, MD Olean Ree, MD  DRUG ALLERGIES:  No Known Allergies  DISCHARGE MEDICATIONS:   Allergies as of 07/12/2018   No Known Allergies     Medication List    TAKE these medications   oxyCODONE-acetaminophen 5-325 MG tablet Commonly known as: PERCOCET/ROXICET Take 1 tablet by mouth every 4 (four) hours as needed for moderate pain or severe  pain. Notes to patient: Last dose was given today at 9:35am         DISCHARGE INSTRUCTIONS:   DIET:  Regular diet  DISCHARGE CONDITION:  Stable  ACTIVITY:  Activity as tolerated  OXYGEN:  Home Oxygen: No.   Oxygen Delivery: room air  DISCHARGE LOCATION:  home   If you experience worsening of your admission symptoms, develop shortness of breath, life threatening emergency, suicidal or homicidal thoughts you must seek medical attention immediately by calling 911 or calling your MD immediately  if symptoms less severe.  You Must read complete instructions/literature along with all the possible adverse reactions/side effects for all the Medicines you take and that have been prescribed to you. Take any new Medicines after you have completely understood and accpet all the possible adverse reactions/side effects.   Please note  You were cared for by a hospitalist during your hospital stay. If you have any questions about your discharge medications or the care you received while you were in the hospital after you are discharged, you can call the unit and asked to speak with the hospitalist on call if the hospitalist that took care of you is not available. Once you are discharged, your primary care physician will handle any further medical issues. Please note that NO REFILLS for any discharge medications will be authorized once you are discharged, as it is imperative that you return to your primary care physician (or establish a relationship with a primary care physician if you do not have one) for your aftercare needs so that they can reassess your need for medications and monitor your lab values.  Today   Status post laparoscopic cholecystectomy today.  Abdominal pain has improved.  No nausea or vomiting.  Tolerating p.o. well.  Will discharge home with outpatient surgical follow-up.  VITAL SIGNS:  Blood pressure 119/76, pulse 100, temperature 98.6 F (37 C), temperature  source Oral, resp. rate 19, height 5\' 4"  (1.626 m), weight 60.7 kg, last menstrual period 07/09/2018, SpO2 98 %.  I/O:    Intake/Output Summary (Last 24 hours) at 07/12/2018 1431 Last data filed at 07/12/2018 1007 Gross per 24 hour  Intake 2132.91 ml  Output 2950 ml  Net -817.09 ml    PHYSICAL EXAMINATION:  GENERAL:  28 y.o.-year-old patient lying in the bed with no acute distress.  EYES: Pupils equal, round, reactive to light and accommodation. No scleral icterus. Extraocular muscles intact.  HEENT: Head atraumatic, normocephalic. Oropharynx and nasopharynx clear.  NECK:  Supple, no jugular venous distention. No thyroid enlargement, no tenderness.  LUNGS: Normal breath sounds bilaterally, no wheezing, rales,rhonchi. No use of accessory muscles of respiration.  CARDIOVASCULAR: S1, S2 normal. No murmurs, rubs, or gallops.  ABDOMEN: Soft, non-tender, non-distended. Bowel sounds present. No organomegaly or mass.  EXTREMITIES: No pedal edema, cyanosis, or clubbing.  NEUROLOGIC: Cranial nerves II through XII are intact. No focal motor or sensory defecits b/l.  PSYCHIATRIC: The patient is alert and oriented x 3.  SKIN: No obvious rash, lesion, or ulcer.   DATA REVIEW:   CBC Recent Labs  Lab 07/11/18 0406  WBC 6.6  HGB 13.0  HCT 38.4  PLT 284    Chemistries  Recent Labs  Lab 07/12/18 0847  NA 139  K 4.4  CL 107  CO2 24  GLUCOSE 103*  BUN 5*  CREATININE 0.74  CALCIUM 8.9  AST 47*  ALT 345*  ALKPHOS 100  BILITOT 0.5    Cardiac Enzymes Recent Labs  Lab 07/09/18 0049  TROPONINI <0.03    Microbiology Results  Results for orders placed or performed during the hospital encounter of 07/09/18  SARS Coronavirus 2 (CEPHEID - Performed in [facility] hospital lab), Hosp Order     Status: None   Collection Time: 07/09/18  4:45 AM   Specimen: Nasopharyngeal Swab  Result Value Ref Range Status   SARS Coronavirus 2 NEGATIVE NEGATIVE Final    Comment: (NOTE) If result is  NEGATIVE SARS-CoV-2 target nucleic acids are NOT DETECTED. The SARS-CoV-2 RNA is generally detectable in upper and lower  respiratory specimens during the acute phase of infection. The lowest  concentration of SARS-CoV-2 viral copies this assay can detect is 250  copies / mL. A negative result does not preclude SARS-CoV-2 infection  and should not be used as the sole basis for treatment or other  patient management decisions.  A negative result may occur with  improper specimen collection / handling, submission of specimen other  than nasopharyngeal swab, presence of viral mutation(s) within the  areas targeted by this assay, and inadequate number of viral copies  (<250 copies / mL). A negative result must be combined with clinical  observations, patient history, and epidemiological information. If result is POSITIVE SARS-CoV-2 target nucleic acids are DETECTED. The SARS-CoV-2 RNA is generally detectable in upper and lower  respiratory specimens dur ing the acute phase of infection.  Positive  results are indicative of active infection with SARS-CoV-2.  Clinical  correlation with patient history and other diagnostic information is  necessary to determine patient infection status.  Positive results do  not rule out bacterial infection or co-infection  with other viruses. If result is PRESUMPTIVE POSTIVE SARS-CoV-2 nucleic acids MAY BE PRESENT.   A presumptive positive result was obtained on the submitted specimen  and confirmed on repeat testing.  While 2019 novel coronavirus  (SARS-CoV-2) nucleic acids may be present in the submitted sample  additional confirmatory testing may be necessary for epidemiological  and / or clinical management purposes  to differentiate between  SARS-CoV-2 and other Sarbecovirus currently known to infect humans.  If clinically indicated additional testing with an alternate test  methodology [phone]([id number]) is advised. The SARS-CoV-2 RNA is generally  detectable  in upper and lower respiratory sp ecimens during the acute  phase of infection. The expected result is Negative. Fact Sheet for Patients:  BoilerBrush.com.cyhttps://www.fda.gov/media/136312/download Fact Sheet for Healthcare Providers: https://pope.com/https://www.fda.gov/media/136313/download This test is not yet approved or cleared by the [place] FDA and has been authorized for detection and/or diagnosis of SARS-CoV-2 by FDA under an Emergency Use Authorization (EUA).  This EUA will remain in effect (meaning this test can be used) for the duration of the COVID-19 declaration under Section 564(b)(1) of the Act, 21 U.S.C. section 360bbb-3(b)(1), unless the authorization is terminated or revoked sooner. Performed at [facility], [address] [place], [place] [address]   Surgical PCR screen     Status: Abnormal   Collection Time: 07/11/18  5:42 AM   Specimen: Nasal Mucosa; Nasal Swab  Result Value Ref Range Status   MRSA, PCR NEGATIVE NEGATIVE Final   Staphylococcus aureus POSITIVE (A) NEGATIVE Final    Comment: (NOTE) The Xpert SA Assay (FDA approved for NASAL specimens in patients 922 years of age and older), is one component of a comprehensive surveillance program. It is not intended to diagnose infection nor to guide or monitor treatment. Performed at [facility], [address] [place], [place] [address]     RADIOLOGY:  Nm Hepatobiliary Liver Func  Result Date: 07/10/2018 CLINICAL DATA:  Cholelithiasis. EXAM: NUCLEAR MEDICINE HEPATOBILIARY IMAGING TECHNIQUE: Sequential images of the abdomen were obtained out to 60 minutes following intravenous administration of radiopharmaceutical. RADIOPHARMACEUTICALS:  5.3 mCi Tc-4162m  Choletec IV COMPARISON:  MRI 07/09/2018. FINDINGS: Normal liver biliary and bowel activity noted. No gallbladder visualized at 1 hour. 3 hour delayed images obtained. Gallbladder is not visualized at 3 hours. Cholecystitis could present in this fashion. IMPRESSION:  Nonvisualization of the gallbladder at 1 and 3 hours. Cholecystitis could present in this fashion. Electronically Signed   By: Maisie Fushomas  Register   On: 07/10/2018 15:31      Management plans discussed with the patient, family and they are in agreement.  CODE STATUS:     Code Status Orders  (From admission, onward)         Start     Ordered   07/09/18 0546  Full code  Continuous     07/09/18 0545         TOTAL TIME TAKING CARE OF THIS PATIENT: 40 minutes.    Houston SirenVivek J Alanna Storti M.D on 07/12/2018 at 2:31 PM  Between 7am to 6pm - Pager - [phone]  After 6pm go to [email or web address] - Social research officer, governmentpassword [email or web address]  [facility]  Office  [phone]  CC: Primary care physician; Patient, No Pcp Per

## 2018-07-12 NOTE — Progress Notes (Signed)
MD ordered patient to be discharged home.  Discharge instructions were reviewed with the patient and she voiced understanding.  Follow-up appointment was made.  Prescriptions given to the patient.  IV was removed with catheter intact.  All patients questions were answered.  Patient left via wheelchair escorted by nursing.

## 2018-07-12 NOTE — Discharge Instructions (Signed)
In addition to included general post-operative instructions for laparoscopic cholecystectomy,  Diet: Resume home diet, may take a few days to asdjust to not having gallbladder. Consider low fat diet.   Activity: No heavy lifting >20 pounds (children, pets, laundry, garbage) or strenuous activity until follow-up in 2 weeks, but light activity and walking are encouraged. Do not drive or drink alcohol if taking narcotic pain medications or having pain that might distract from driving.  Wound care: 2 days after surgery (06/20), you may shower/get incision wet with soapy water and pat dry (do not rub incisions), but no baths or submerging incision underwater until follow-up.   Medications: Resume all home medications. For mild to moderate pain: acetaminophen (Tylenol) or ibuprofen/naproxen (if no kidney disease). Combining Tylenol with alcohol can substantially increase your risk of causing liver disease. Narcotic pain medications, if prescribed, can be used for severe pain, though may cause nausea, constipation, and drowsiness. Do not combine Tylenol and Percocet (or similar) within a 6 hour period as Percocet (and similar) contain(s) Tylenol. If you do not need the narcotic pain medication, you do not need to fill the prescription.  Call office [phone] / [phone]) at any time if any questions, worsening pain, fevers/chills, bleeding, drainage from incision site, or other concerns.

## 2018-07-12 NOTE — Progress Notes (Addendum)
[facility] Day(s): 3.   Post op day(s): 1 Day Post-Op.   Interval History: Patient seen and examined, no acute events or new complaints overnight. Patient reports that she is feeling much better with just some incisional soreness. No fever, chills, nausea, or emesis. She has been tolerating a diet post-op. Mobilizing well. Endorses bowel function. No other issues.    Vital signs in last 24 hours: [min-max] current  Temp:  [97 F (36.1 C)-99.1 F (37.3 C)] 98.6 F (37 C) (06/19 0500) Pulse Rate:  [66-101] 100 (06/19 0500) Resp:  [12-19] 19 (06/19 0500) BP: (104-125)/(71-91) 119/76 (06/19 0500) SpO2:  [97 %-99 %] 98 % (06/19 0500) Weight:  [56.6 kg-60.7 kg] 60.7 kg (06/19 0500)     Height: 5\' 4"  (162.6 cm) Weight: 60.7 kg BMI (Calculated): 22.96   Intake/Output last 2 shifts:  06/18 0701 - 06/19 0700 In: 3720.5 [I.V.:3620.5; IV Piggyback:100] Out: 2305 [Urine:2300; Blood:5]   Physical Exam:  Constitutional: alert, cooperative and no distress  Respiratory: breathing non-labored at rest  Cardiovascular: regular rate and sinus rhythm  Gastrointestinal: soft, incisional soreness, and non-distended. No rebound/guarding Integumentary: Laparoscopic incisions are CDI with skin glue, some ecchymosis, no erythema or drainage  Labs:  CBC Latest Ref Rng & Units 07/11/2018 07/10/2018 07/09/2018  WBC 4.0 - 10.5 K/uL 6.6 6.6 15.3(H)  Hemoglobin 12.0 - 15.0 g/dL 13.0 12.9 12.9  Hematocrit 36.0 - 46.0 % 38.4 38.2 37.5  Platelets 150 - 400 K/uL 284 288 330   CMP Latest Ref Rng & Units 07/11/2018 07/10/2018 07/09/2018  Glucose 70 - 99 mg/dL 98 97 -  BUN 6 - 20 mg/dL 7 7 -  Creatinine 0.44 - 1.00 mg/dL 0.65 0.66 -  Sodium 135 - 145 mmol/L 139 139 -  Potassium 3.5 - 5.1 mmol/L 4.3 4.3 -  Chloride 98 - 111 mmol/L 108 106 -  CO2 22 - 32 mmol/L 24 24 -  Calcium 8.9 - 10.3 mg/dL 9.0 8.9 -  Total Protein 6.5 - 8.1 g/dL 6.7 6.8 6.8  Total Bilirubin  0.3 - 1.2 mg/dL 0.7 0.9 0.5  Alkaline Phos 38 - 126 U/L 132(H) 153(H) 97  AST 15 - 41 U/L 129(H) 659(H) 374(H)  ALT 0 - 44 U/L 650(H) 1,143(H) 212(H)     Imaging studies: No new pertinent imaging studies   Assessment/Plan: (ICD-10's: K81.00) 28 y.o. female with expected post-op soreness otherwise doing well 1 Day Post-Op s/p laparoscopic cholecystectomy for acute cholecystitis.   - Advance to low fat regular diet  - Discontinue IVF  - pain control prn; antiemetics prn  - Monitor abdominal examination; on-going bowel function  - further management per primary team    - Discharge planning: Okay for discharge from surgical standpoint, discussed restrictions and post-op care, will follow up with Dr Dahlia Byes in 2 weeks  All of the above findings and recommendations were discussed with the patient, and the medical team, and all of patient's questions were answered to her expressed satisfaction.  -- Edison Simon, PA-C [facility] 07/12/2018, 8:07 AM [phone] M-F: 7am - 4pm  I saw and evaluated the patient.  I agree with the above documentation, exam, and plan, which I have edited where appropriate. Fredirick Maudlin  11:13 AM

## 2018-07-16 NOTE — Anesthesia Postprocedure Evaluation (Signed)
Anesthesia Post Note  Patient: Lorraine Thompson  Procedure(s) Performed: LAPAROSCOPIC CHOLECYSTECTOMY (N/A ) LIVER BIOPSY (N/A )  Patient location during evaluation: PACU Anesthesia Type: General Level of consciousness: awake and alert Pain management: pain level controlled Vital Signs Assessment: post-procedure vital signs reviewed and stable Respiratory status: spontaneous breathing, nonlabored ventilation and respiratory function stable Cardiovascular status: blood pressure returned to baseline and stable Postop Assessment: no apparent nausea or vomiting Anesthetic complications: no     Last Vitals:  Vitals:   07/11/18 2047 07/12/18 0500  BP: 112/71 119/76  Pulse: (!) 101 100  Resp: 18 19  Temp: 37.3 C 37 C  SpO2: 97% 98%    Last Pain:  Vitals:   07/12/18 1034  TempSrc:   PainSc: 3                  Susano Cleckler Harvie Heck

## 2018-07-17 ENCOUNTER — Other Ambulatory Visit: Payer: Medicaid - Out of State

## 2018-07-23 LAB — VARICELLA-ZOSTER BY PCR: Varicella-Zoster, PCR: NEGATIVE

## 2018-07-24 ENCOUNTER — Other Ambulatory Visit: Payer: Self-pay

## 2018-07-24 ENCOUNTER — Telehealth (INDEPENDENT_AMBULATORY_CARE_PROVIDER_SITE_OTHER): Payer: Medicaid - Out of State | Admitting: Surgery

## 2018-07-24 DIAGNOSIS — Z09 Encounter for follow-up examination after completed treatment for conditions other than malignant neoplasm: Secondary | ICD-10-CM

## 2018-07-24 NOTE — Progress Notes (Signed)
Phone call with the pt S/p lap chole and Liver biopsy Path d/w pt in detail Doing very well No complaints. Taking Po, no fevers or chills No pain  A/P doing very well w/o complications No heavy lifting  RTC prn

## 2020-01-24 IMAGING — MR MR ABDOMEN WO/W CM MRCP
21 of 23 series · 45 of 48 positions shown · IV contrast (Gadavist)
Comparison: Abdomen ultrasound performed earlier today.

CLINICAL DATA: Right upper quadrant and epigastric pain.
Cholelithiasis.

EXAM:
MRI ABDOMEN WITHOUT AND WITH CONTRAST (INCLUDING MRCP)
TECHNIQUE: Multiplanar multisequence MR imaging of the abdomen was performed
both before and after the administration of intravenous contrast.
Heavily T2-weighted images of the biliary and pancreatic ducts were
obtained, and three-dimensional MRCP images were rendered by post
processing.
CONTRAST:  5 mL Gadavist

[Series 3: bSSFP · coronal · 6.0mm · 0.74mm/px · 2 of 28 slices shown]
[im 1/28]
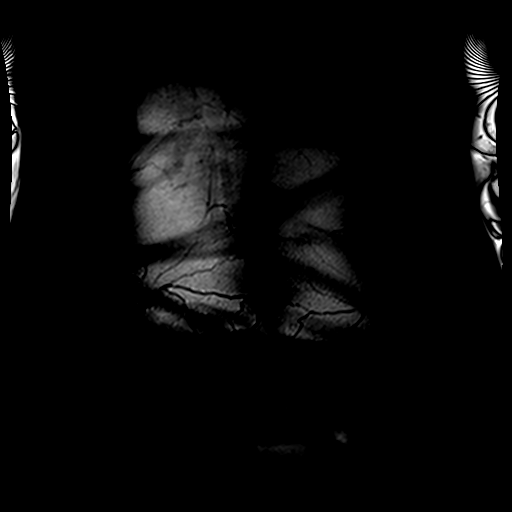
[im 28/28]
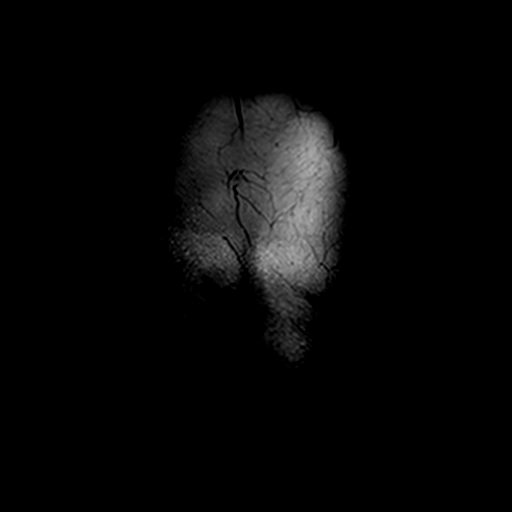

[Series 4: T2 · axial · 6.0mm · 1.19mm/px · z∈[-177,+68]mm · 2 of 35 slices shown]
[im 1/35]
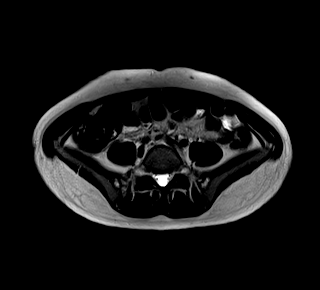
[im 35/35]
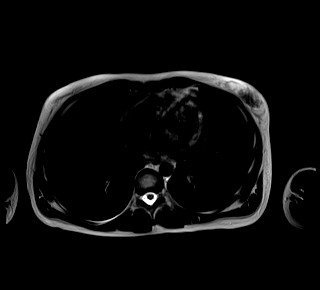

[Series 5: T1 · axial · 6.0mm · 0.74mm/px · z∈[-177,+68]mm · 2 of 35 slices shown (1 of 2)]
[im 1/35]
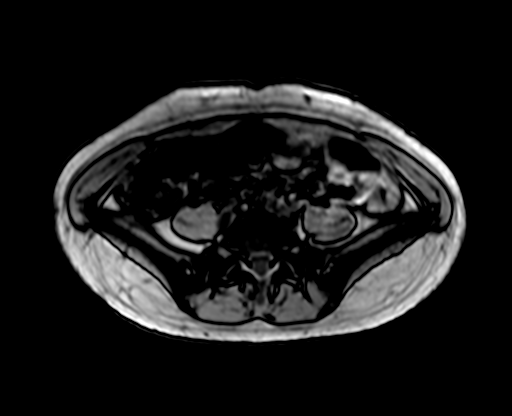
[im 35/35]
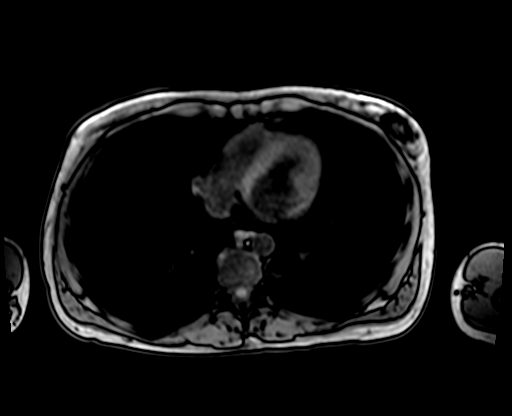

[Series 5: T1 · axial · 6.0mm · 0.74mm/px · z∈[-177,+68]mm · 2 of 35 slices shown (2 of 2)]
[im 1/35]
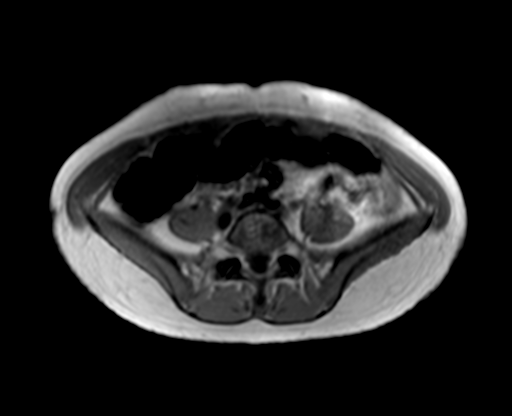
[im 35/35]
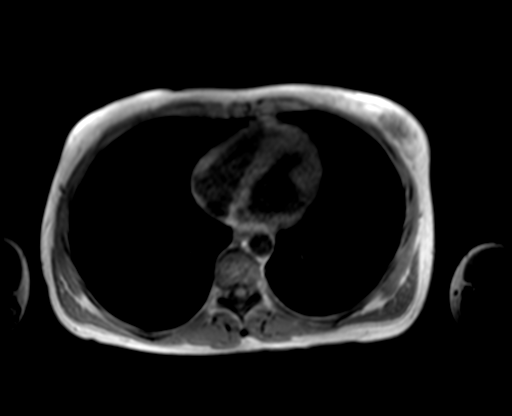

[Series 8: T2 fat-sat · axial · 6.0mm · 1.19mm/px · z∈[-181,+86]mm · 2 of 38 slices shown]
[im 1/38]
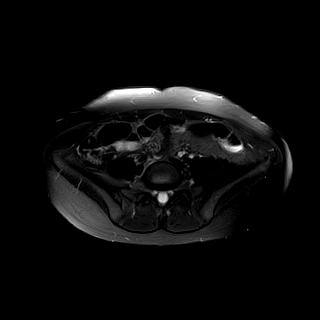
[im 38/38]
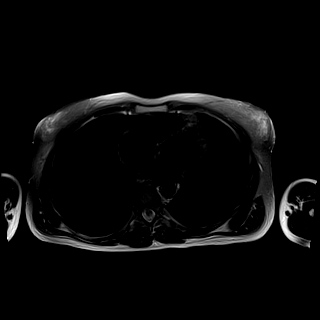

[Series 9: ax dwi_tracew · axial · 6.0mm · 1.42mm/px · 1 of 38 slices shown (1 of 3)]
[im 1/38]
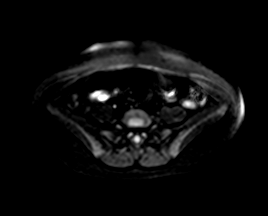

[Series 9: ax dwi_tracew · axial · 6.0mm · 1.42mm/px · 1 of 38 slices shown (2 of 3)]
[im 1/38]
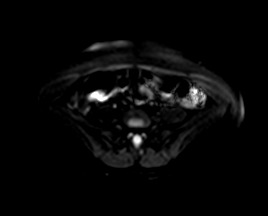

[Series 9: ax dwi_tracew · axial · 6.0mm · 1.42mm/px · 1 of 38 slices shown (3 of 3)]
[im 1/38]
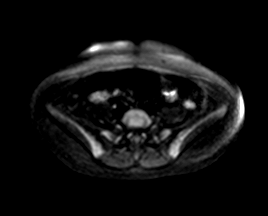

[Series 10: ax dwi_adc · axial · 6.0mm · 1.42mm/px · 1 of 38 slices shown]
[im 1/38]
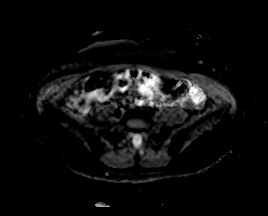

[Series 14: MRCP · coronal · 3.0mm · 1.12mm/px · 1 of 17 slices shown]
[im 1/17]
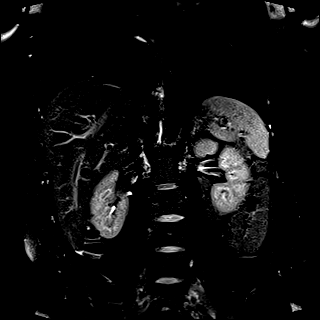

[Series 18: radials · oblique · 50.0mm · 0.78mm/px · 1 of 5 slices shown]
[im 1/5]
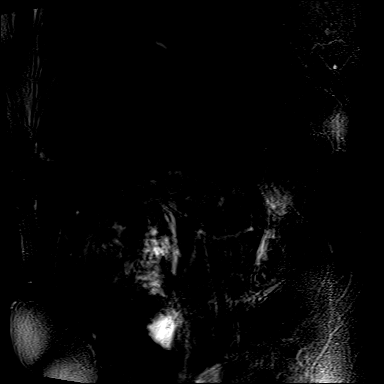

[Series 19: T1 dynamic fat-sat · axial · non-contrast · 3.0mm · 1.19mm/px · z∈[-185,+76]mm · 3 of 88 slices shown (1 of 5)]
[im 1/88]
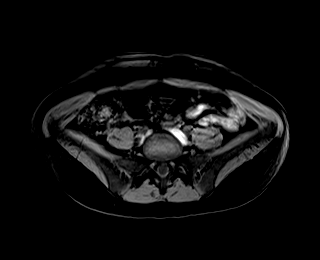
[im 44/88]
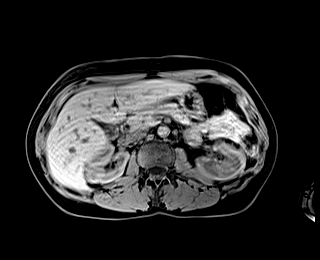
[im 88/88]
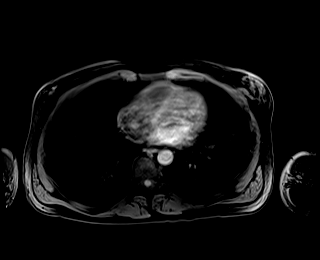

[Series 20: T1 dynamic fat-sat post-contrast · axial · 3.0mm · 1.19mm/px · z∈[-185,+76]mm · 3 of 88 slices shown (1 of 4)]
[im 1/88]
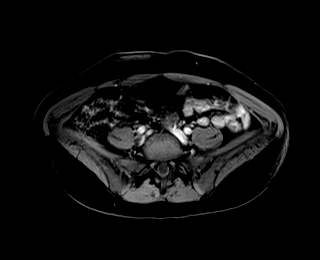
[im 44/88]
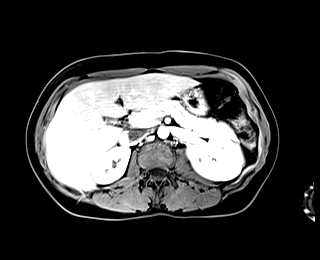
[im 88/88]
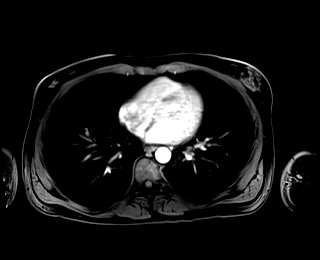

[Series 21: T1 dynamic fat-sat · axial · 3.0mm · 1.19mm/px · z∈[-185,+76]mm · 3 of 88 slices shown (2 of 5)]
[im 1/88]
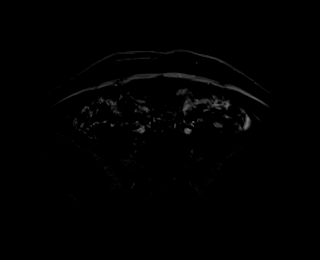
[im 44/88]
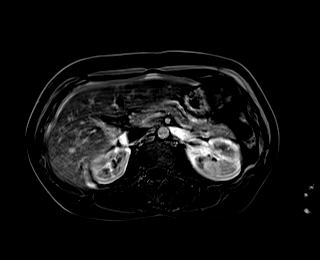
[im 88/88]
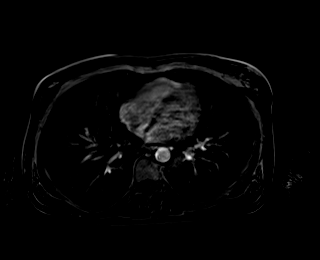

[Series 22: T1 dynamic fat-sat post-contrast · axial · 3.0mm · 1.19mm/px · z∈[-185,+76]mm · 3 of 88 slices shown (2 of 4)]
[im 1/88]
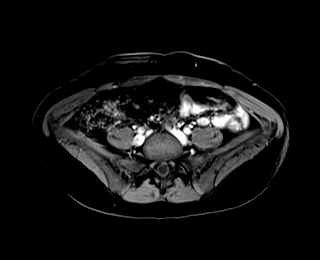
[im 44/88]
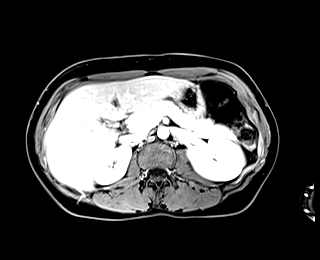
[im 88/88]
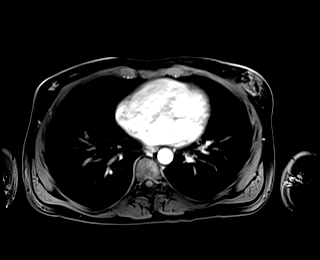

[Series 23: T1 dynamic fat-sat · axial · 3.0mm · 1.19mm/px · z∈[-185,+76]mm · 3 of 88 slices shown (3 of 5)]
[im 1/88]
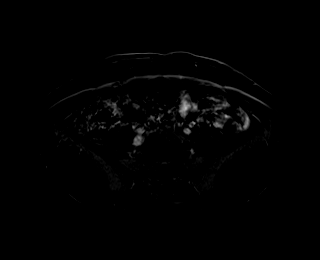
[im 44/88]
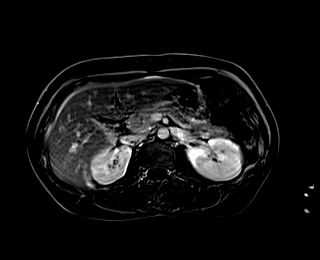
[im 88/88]
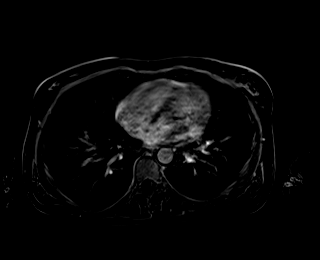

[Series 24: T1 dynamic fat-sat post-contrast · axial · 3.0mm · 1.19mm/px · z∈[-185,+76]mm · 3 of 88 slices shown (3 of 4)]
[im 1/88]
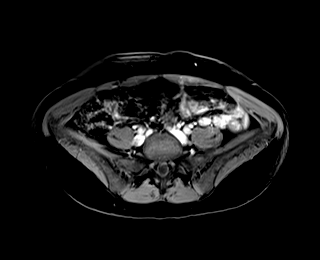
[im 44/88]
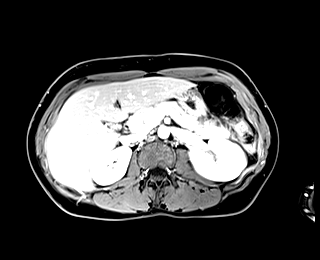
[im 88/88]
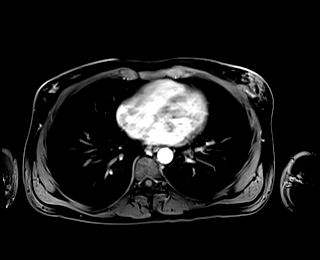

[Series 25: T1 dynamic fat-sat · axial · 3.0mm · 1.19mm/px · z∈[-185,+76]mm · 3 of 88 slices shown (4 of 5)]
[im 1/88]
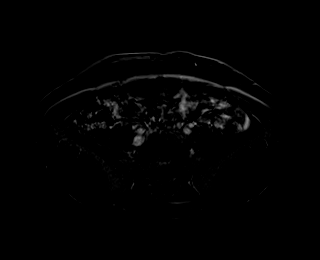
[im 44/88]
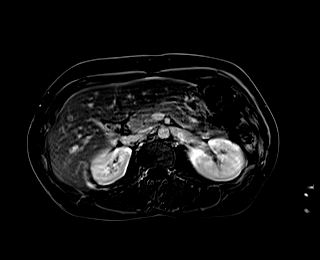
[im 88/88]
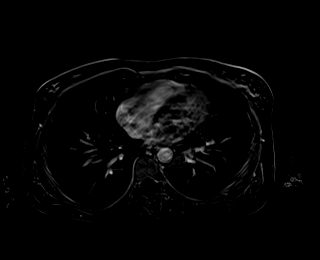

[Series 26: T1 dynamic post-contrast · coronal · 3.0mm · 1.31mm/px · 2 of 64 slices shown]
[im 1/64]
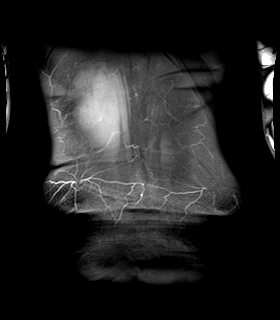
[im 64/64]
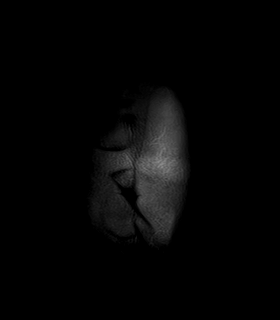

[Series 27: T1 dynamic fat-sat post-contrast · axial · 3.0mm · 1.19mm/px · z∈[-185,+76]mm · 3 of 88 slices shown (4 of 4)]
[im 1/88]
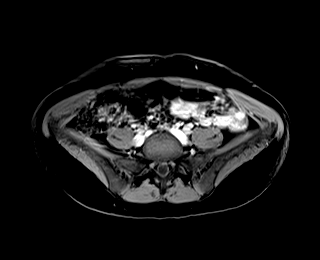
[im 44/88]
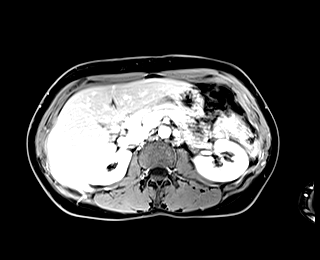
[im 88/88]
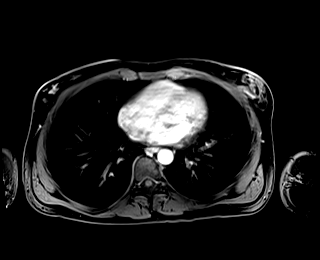

[Series 28: T1 dynamic fat-sat · axial · 3.0mm · 1.19mm/px · z∈[-185,+76]mm · 3 of 88 slices shown (5 of 5)]
[im 1/88]
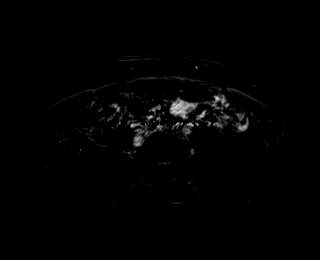
[im 44/88]
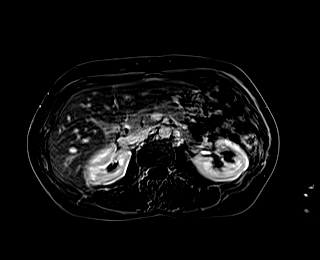
[im 88/88]
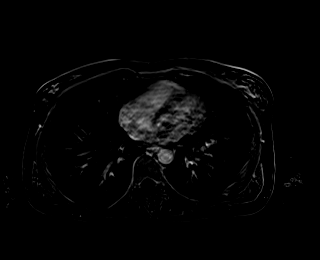

[45 of 48 positions shown; findings below may reference images not displayed]

FINDINGS: Lower chest: No acute findings.

Hepatobiliary: No hepatic masses identified. Numerous tiny
gallstones are seen, however there is no evidence of cholecystitis.
No evidence of biliary ductal dilatation, with common bile duct
measuring 4 mm. No evidence of choledocholithiasis.

Pancreas: No mass or inflammatory changes. No evidence of pancreatic
ductal dilatation or pancreas divisum.

Spleen:  Within normal limits in size and appearance.

Adrenals/Urinary Tract: No masses identified. No evidence of
hydronephrosis.

Stomach/Bowel: Visualized portion unremarkable.

Vascular/Lymphatic: No pathologically enlarged lymph nodes
identified. No abdominal aortic aneurysm.

Other:  None.

Musculoskeletal:  No suspicious bone lesions identified.
IMPRESSION: Cholelithiasis. No radiographic evidence of cholecystitis or other
acute findings.

No evidence of biliary ductal dilatation or choledocholithiasis.
